# Patient Record
Sex: Female | Born: 1995
Health system: Southern US, Community
[De-identification: ages and names within clinical notes are randomized; demographics above are authoritative.]

## PROBLEM LIST (undated history)

## (undated) DIAGNOSIS — E282 Polycystic ovarian syndrome: Secondary | ICD-10-CM

## (undated) DIAGNOSIS — R000 Tachycardia, unspecified: Secondary | ICD-10-CM

## (undated) DIAGNOSIS — H5210 Myopia, unspecified eye: Secondary | ICD-10-CM

## (undated) DIAGNOSIS — I959 Hypotension, unspecified: Secondary | ICD-10-CM

## (undated) HISTORY — DX: Tachycardia, unspecified: R00.0

## (undated) HISTORY — DX: Myopia, unspecified eye: H52.10

## (undated) HISTORY — DX: Polycystic ovarian syndrome: E28.2

## (undated) HISTORY — DX: Hypotension, unspecified: I95.9

---
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
## 2016-02-16 DIAGNOSIS — R61 Generalized hyperhidrosis: Secondary | ICD-10-CM | POA: Insufficient documentation

## 2016-02-16 DIAGNOSIS — L700 Acne vulgaris: Secondary | ICD-10-CM | POA: Insufficient documentation

## 2016-02-16 DIAGNOSIS — I889 Nonspecific lymphadenitis, unspecified: Secondary | ICD-10-CM | POA: Insufficient documentation

## 2017-09-04 DIAGNOSIS — H5213 Myopia, bilateral: Secondary | ICD-10-CM | POA: Insufficient documentation

## 2017-09-25 DIAGNOSIS — R635 Abnormal weight gain: Secondary | ICD-10-CM | POA: Insufficient documentation

## 2017-09-25 DIAGNOSIS — L680 Hirsutism: Secondary | ICD-10-CM | POA: Insufficient documentation

## 2017-10-02 DIAGNOSIS — E282 Polycystic ovarian syndrome: Secondary | ICD-10-CM | POA: Insufficient documentation

## 2019-12-27 DIAGNOSIS — H16223 Keratoconjunctivitis sicca, not specified as Sjogren's, bilateral: Secondary | ICD-10-CM | POA: Insufficient documentation

## 2019-12-27 DIAGNOSIS — H43393 Other vitreous opacities, bilateral: Secondary | ICD-10-CM | POA: Insufficient documentation

## 2020-11-19 DIAGNOSIS — Z01419 Encounter for gynecological examination (general) (routine) without abnormal findings: Secondary | ICD-10-CM | POA: Diagnosis not present

## 2020-11-19 DIAGNOSIS — E282 Polycystic ovarian syndrome: Secondary | ICD-10-CM | POA: Diagnosis not present

## 2020-11-19 DIAGNOSIS — Z6836 Body mass index (BMI) 36.0-36.9, adult: Secondary | ICD-10-CM | POA: Diagnosis not present

## 2020-11-20 LAB — HM PAP SMEAR: HM Pap smear: NEGATIVE

## 2020-12-07 DIAGNOSIS — H04123 Dry eye syndrome of bilateral lacrimal glands: Secondary | ICD-10-CM | POA: Diagnosis not present

## 2020-12-07 DIAGNOSIS — H52203 Unspecified astigmatism, bilateral: Secondary | ICD-10-CM | POA: Diagnosis not present

## 2020-12-07 DIAGNOSIS — H5213 Myopia, bilateral: Secondary | ICD-10-CM | POA: Diagnosis not present

## 2021-03-28 ENCOUNTER — Other Ambulatory Visit: Payer: Self-pay

## 2021-03-28 ENCOUNTER — Ambulatory Visit (INDEPENDENT_AMBULATORY_CARE_PROVIDER_SITE_OTHER): Payer: 59 | Admitting: Family Medicine

## 2021-03-28 ENCOUNTER — Encounter: Payer: Self-pay | Admitting: Family Medicine

## 2021-03-28 VITALS — BP 122/84 | HR 87 | Temp 97.2°F | Ht 64.5 in | Wt 214.8 lb

## 2021-03-28 DIAGNOSIS — L732 Hidradenitis suppurativa: Secondary | ICD-10-CM | POA: Insufficient documentation

## 2021-03-28 DIAGNOSIS — E282 Polycystic ovarian syndrome: Secondary | ICD-10-CM

## 2021-03-28 DIAGNOSIS — Z13220 Encounter for screening for lipoid disorders: Secondary | ICD-10-CM

## 2021-03-28 DIAGNOSIS — Z1329 Encounter for screening for other suspected endocrine disorder: Secondary | ICD-10-CM

## 2021-03-28 DIAGNOSIS — Z0000 Encounter for general adult medical examination without abnormal findings: Secondary | ICD-10-CM

## 2021-03-28 NOTE — Progress Notes (Signed)
Anne Cardenas is a 25 y.o. female  Chief Complaint  Patient presents with  . New Patient (Initial Visit)    Pt is here for physical, states she has had both breast and pp done by her OB, done Nov 19, 2020, pap was normal.     HPI: NIKYAH Cardenas is a 25 y.o. female seen today to establish care with our office and for annual CPE, labs.  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   Last PAP: follows with GYN Dr. Linda Hedges, normal PAP in 10/2020  Diet/Exercise: walks a lot at work but no regular CV exercise, maybe once per week; overall healthy diet Dental: UTD Vision: wears contacts, UTD on exam  Med refills needed today? none  Past Medical History:  Diagnosis Date  . PCOS (polycystic ovarian syndrome)     History reviewed. No pertinent surgical history.  Social History   Socioeconomic History  . Marital status: Single    Spouse name: Not on file  . Number of children: Not on file  . Years of education: Not on file  . Highest education level: Not on file  Occupational History  . Not on file  Tobacco Use  . Smoking status: Never Smoker  . Smokeless tobacco: Never Used  Substance and Sexual Activity  . Alcohol use: No  . Drug use: No  . Sexual activity: Not Currently    Birth control/protection: None  Other Topics Concern  . Not on file  Social History Narrative  . Not on file   Social Determinants of Health   Financial Resource Strain: Not on file  Food Insecurity: Not on file  Transportation Needs: Not on file  Physical Activity: Not on file  Stress: Not on file  Social Connections: Not on file  Intimate Partner Violence: Not on file    Family History  Problem Relation Age of Onset  . Hypertension Mother   . Anxiety disorder Father   . Hyperlipidemia Father   . ADD / ADHD Sister   . Depression Sister   . Stroke Paternal Grandfather   . Heart disease Paternal Grandfather   . Hashimoto's thyroiditis Sister   . Anxiety disorder Sister   . ADD /  ADHD Sister       There is no immunization history on file for this patient.  Outpatient Encounter Medications as of 03/28/2021  Medication Sig Note  . aluminum chloride (DRYSOL) 20 % external solution Apply to affected area QHS   . bifidobacterium infantis (ALIGN) capsule 1 cap daily for bowel changes   . progesterone (PROMETRIUM) 100 MG capsule Take 100 mg by mouth daily. 03/28/2021: Pt takes this day 15-28 of cycle   . spironolactone (ALDACTONE) 50 MG tablet 100 mg.    No facility-administered encounter medications on file as of 03/28/2021.     ROS: Gen: no fever, chills  Skin: no rash, itching ENT: no ear pain, ear drainage, nasal congestion, rhinorrhea, sinus pressure, sore throat Eyes: no blurry vision, double vision Resp: no cough, wheeze,SOB Breast: no breast tenderness, no nipple discharge, no breast masses CV: no CP, palpitations, LE edema,  GI: no heartburn, n/v/d/c, abd pain GU: no dysuria, urgency, frequency, hematuria;+ PCOS MSK: no joint pain, myalgias, back pain Neuro: no dizziness, headache, weakness Psych: no depression, anxiety, insomnia   Allergies  Allergen Reactions  . Ciprofloxacin Anxiety    Elevated heart rate and panicked feeling     BP 122/84 (BP Location: Left Arm, Patient Position: Sitting,  Cuff Size: Normal)   Pulse 87   Temp (!) 97.2 F (36.2 C) (Temporal)   Ht 5' 4.5" (1.638 m)   Wt 214 lb 12.8 oz (97.4 kg)   SpO2 99%   BMI 36.30 kg/m    Wt Readings from Last 3 Encounters:  03/28/21 214 lb 12.8 oz (97.4 kg)  01/23/13 150 lb (68 kg) (86 %, Z= 1.09)*   * Growth percentiles are based on CDC (Girls, 2-20 Years) data.   Temp Readings from Last 3 Encounters:  03/28/21 (!) 97.2 F (36.2 C) (Temporal)  01/23/13 99.5 F (37.5 C) (Oral)   BP Readings from Last 3 Encounters:  03/28/21 122/84  01/23/13 124/80 (92 %, Z = 1.41 /  94 %, Z = 1.55)*   *BP percentiles are based on the 2017 AAP Clinical Practice Guideline for girls   Pulse  Readings from Last 3 Encounters:  03/28/21 87  01/23/13 120     Physical Exam Constitutional:      General: She is not in acute distress.    Appearance: She is well-developed. She is not ill-appearing.  HENT:     Head: Normocephalic and atraumatic.     Right Ear: Tympanic membrane and ear canal normal.     Left Ear: Tympanic membrane and ear canal normal.     Nose: Nose normal.     Mouth/Throat:     Mouth: Mucous membranes are moist.     Pharynx: Oropharynx is clear.  Eyes:     Conjunctiva/sclera: Conjunctivae normal.  Neck:     Thyroid: No thyromegaly.  Cardiovascular:     Rate and Rhythm: Normal rate and regular rhythm.     Heart sounds: Normal heart sounds. No murmur heard.   Pulmonary:     Effort: Pulmonary effort is normal. No respiratory distress.     Breath sounds: Normal breath sounds. No wheezing or rhonchi.  Abdominal:     General: Bowel sounds are normal. There is no distension.     Palpations: Abdomen is soft. There is no mass.     Tenderness: There is no abdominal tenderness.  Musculoskeletal:     Cervical back: Neck supple.     Right lower leg: No edema.     Left lower leg: No edema.  Lymphadenopathy:     Cervical: No cervical adenopathy.  Skin:    General: Skin is warm and dry.  Neurological:     Mental Status: She is alert and oriented to person, place, and time.     Motor: No abnormal muscle tone.     Coordination: Coordination normal.  Psychiatric:        Behavior: Behavior normal.      A/P:  1. Annual physical exam - discussed importance of regular CV exercise, healthy diet, adequate sleep - UTD on dental and vision - UTD on PAP, CBE - immunizations UTD - CBC - Basic metabolic panel - AST - ALT - next CPE in 1 year  2. Screening for lipid disorders - Direct LDL  3. Screening for thyroid disorder - sister with thyroid dysfunction - TSH  4. PCOS (polycystic ovarian syndrome) - follows with GYN Dr. Lynnette Caffey - Hemoglobin  A1c    This visit occurred during the SARS-CoV-2 public health emergency.  Safety protocols were in place, including screening questions prior to the visit, additional usage of staff PPE, and extensive cleaning of exam room while observing appropriate contact time as indicated for disinfecting solutions.

## 2021-03-29 ENCOUNTER — Encounter: Payer: Self-pay | Admitting: Family Medicine

## 2021-03-29 LAB — BASIC METABOLIC PANEL
BUN/Creatinine Ratio: 21 (ref 9–23)
BUN: 17 mg/dL (ref 6–20)
CO2: 20 mmol/L (ref 20–29)
Calcium: 10.2 mg/dL (ref 8.7–10.2)
Chloride: 100 mmol/L (ref 96–106)
Creatinine, Ser: 0.81 mg/dL (ref 0.57–1.00)
Glucose: 88 mg/dL (ref 65–99)
Potassium: 4.7 mmol/L (ref 3.5–5.2)
Sodium: 139 mmol/L (ref 134–144)
eGFR: 103 mL/min/{1.73_m2} (ref >59)

## 2021-03-29 LAB — HEMOGLOBIN A1C
Est. average glucose Bld gHb Est-mCnc: 97 mg/dL
Hgb A1c MFr Bld: 5 % (ref 4.8–5.6)

## 2021-03-29 LAB — ALT: ALT: 15 IU/L (ref 0–32)

## 2021-03-29 LAB — CBC
Hematocrit: 44.2 % (ref 34.0–46.6)
Hemoglobin: 15.4 g/dL (ref 11.1–15.9)
MCH: 29.1 pg (ref 26.6–33.0)
MCHC: 34.8 g/dL (ref 31.5–35.7)
MCV: 84 fL (ref 79–97)
Platelets: 313 10*3/uL (ref 150–450)
RBC: 5.29 x10E6/uL — ABNORMAL HIGH (ref 3.77–5.28)
RDW: 12.2 % (ref 11.7–15.4)
WBC: 9.9 10*3/uL (ref 3.4–10.8)

## 2021-03-29 LAB — LDL CHOLESTEROL, DIRECT: Direct LDL: 98 mg/dL

## 2021-03-29 LAB — TSH: TSH: 1.44 u[IU]/mL (ref 0.450–4.500)

## 2021-03-29 LAB — AST: AST: 17 IU/L (ref 0–40)

## 2021-11-08 ENCOUNTER — Other Ambulatory Visit (HOSPITAL_BASED_OUTPATIENT_CLINIC_OR_DEPARTMENT_OTHER): Payer: Self-pay

## 2021-11-08 ENCOUNTER — Other Ambulatory Visit: Payer: Self-pay

## 2021-11-08 ENCOUNTER — Ambulatory Visit: Payer: 59 | Attending: Internal Medicine

## 2021-11-08 DIAGNOSIS — Z23 Encounter for immunization: Secondary | ICD-10-CM

## 2021-11-08 MED ORDER — PFIZER COVID-19 VAC BIVALENT 30 MCG/0.3ML IM SUSP
INTRAMUSCULAR | 0 refills | Status: DC
  Filled 2021-11-08: qty 0.3, 1d supply, fill #0

## 2021-11-08 NOTE — Progress Notes (Signed)
   Covid-19 Vaccination Clinic  Name:  Anne Cardenas    MRN: [id number] DOB: 12/06/1996  11/08/2021  [name] was observed post Covid-19 immunization for 15 minutes without incident. She was provided with Vaccine Information Sheet and instruction to access the V-Safe system.   [name] was instructed to call 911 with any severe reactions post vaccine: Difficulty breathing  Swelling of face and throat  A fast heartbeat  A bad rash all over body  Dizziness and weakness   Immunizations Administered     Name Date Dose VIS Date Route   Pfizer Covid-19 Vaccine Bivalent Booster 11/08/2021  3:34 PM 0.3 mL 08/28/2021 Intramuscular   Manufacturer: [place]   Lot: [id number]   [place]: [phone]

## 2021-12-13 DIAGNOSIS — H5213 Myopia, bilateral: Secondary | ICD-10-CM | POA: Diagnosis not present

## 2021-12-13 DIAGNOSIS — H52203 Unspecified astigmatism, bilateral: Secondary | ICD-10-CM | POA: Diagnosis not present

## 2022-01-15 DIAGNOSIS — Z01419 Encounter for gynecological examination (general) (routine) without abnormal findings: Secondary | ICD-10-CM | POA: Diagnosis not present

## 2022-01-15 DIAGNOSIS — E282 Polycystic ovarian syndrome: Secondary | ICD-10-CM | POA: Diagnosis not present

## 2022-01-15 DIAGNOSIS — Z6831 Body mass index (BMI) 31.0-31.9, adult: Secondary | ICD-10-CM | POA: Diagnosis not present

## 2022-01-15 DIAGNOSIS — R61 Generalized hyperhidrosis: Secondary | ICD-10-CM | POA: Diagnosis not present

## 2022-04-02 ENCOUNTER — Encounter: Payer: 59 | Admitting: Family Medicine

## 2022-04-24 ENCOUNTER — Emergency Department (HOSPITAL_COMMUNITY)
Admission: EM | Admit: 2022-04-24 | Discharge: 2022-04-24 | Disposition: A | Discharge status: Home / Self Care | Payer: 59 | Attending: Emergency Medicine | Admitting: Emergency Medicine

## 2022-04-24 ENCOUNTER — Emergency Department (HOSPITAL_COMMUNITY): Payer: 59

## 2022-04-24 ENCOUNTER — Encounter (HOSPITAL_COMMUNITY): Payer: Self-pay | Admitting: Emergency Medicine

## 2022-04-24 ENCOUNTER — Other Ambulatory Visit: Payer: Self-pay

## 2022-04-24 DIAGNOSIS — R000 Tachycardia, unspecified: Secondary | ICD-10-CM | POA: Diagnosis not present

## 2022-04-24 DIAGNOSIS — Z20822 Contact with and (suspected) exposure to covid-19: Secondary | ICD-10-CM | POA: Insufficient documentation

## 2022-04-24 DIAGNOSIS — I10 Essential (primary) hypertension: Secondary | ICD-10-CM | POA: Diagnosis not present

## 2022-04-24 DIAGNOSIS — R509 Fever, unspecified: Secondary | ICD-10-CM | POA: Insufficient documentation

## 2022-04-24 DIAGNOSIS — R42 Dizziness and giddiness: Secondary | ICD-10-CM | POA: Insufficient documentation

## 2022-04-24 DIAGNOSIS — D72829 Elevated white blood cell count, unspecified: Secondary | ICD-10-CM | POA: Diagnosis not present

## 2022-04-24 LAB — URINALYSIS, ROUTINE W REFLEX MICROSCOPIC
Bacteria, UA: NONE SEEN
Bilirubin Urine: NEGATIVE
Glucose, UA: NEGATIVE mg/dL
Ketones, ur: 20 mg/dL — AB
Leukocytes,Ua: NEGATIVE
Nitrite: NEGATIVE
Protein, ur: NEGATIVE mg/dL
Specific Gravity, Urine: 1.016 (ref 1.005–1.030)
pH: 7 (ref 5.0–8.0)

## 2022-04-24 LAB — COMPREHENSIVE METABOLIC PANEL
ALT: 20 U/L (ref 0–44)
AST: 18 U/L (ref 15–41)
Albumin: 4.6 g/dL (ref 3.5–5.0)
Alkaline Phosphatase: 80 U/L (ref 38–126)
Anion gap: 8 (ref 5–15)
BUN: 15 mg/dL (ref 6–20)
CO2: 25 mmol/L (ref 22–32)
Calcium: 9.3 mg/dL (ref 8.9–10.3)
Chloride: 105 mmol/L (ref 98–111)
Creatinine, Ser: 0.95 mg/dL (ref 0.44–1.00)
GFR, Estimated: >60 mL/min (ref >60)
Glucose, Bld: 103 mg/dL — ABNORMAL HIGH (ref 70–99)
Potassium: 3.8 mmol/L (ref 3.5–5.1)
Sodium: 138 mmol/L (ref 135–145)
Total Bilirubin: 1.3 mg/dL — ABNORMAL HIGH (ref 0.3–1.2)
Total Protein: 7.3 g/dL (ref 6.5–8.1)

## 2022-04-24 LAB — I-STAT BETA HCG BLOOD, ED (MC, WL, AP ONLY): I-stat hCG, quantitative: <5 m[IU]/mL (ref <5)

## 2022-04-24 LAB — CBC WITH DIFFERENTIAL/PLATELET
Abs Immature Granulocytes: 0.11 10*3/uL — ABNORMAL HIGH (ref 0.00–0.07)
Basophils Absolute: 0 10*3/uL (ref 0.0–0.1)
Basophils Relative: 0 %
Eosinophils Absolute: 0.1 10*3/uL (ref 0.0–0.5)
Eosinophils Relative: 0 %
HCT: 44.8 % (ref 36.0–46.0)
Hemoglobin: 15.6 g/dL — ABNORMAL HIGH (ref 12.0–15.0)
Immature Granulocytes: 1 %
Lymphocytes Relative: 6 %
Lymphs Abs: 0.9 10*3/uL (ref 0.7–4.0)
MCH: 29.4 pg (ref 26.0–34.0)
MCHC: 34.8 g/dL (ref 30.0–36.0)
MCV: 84.4 fL (ref 80.0–100.0)
Monocytes Absolute: 0.6 10*3/uL (ref 0.1–1.0)
Monocytes Relative: 3 %
Neutro Abs: 15.1 10*3/uL — ABNORMAL HIGH (ref 1.7–7.7)
Neutrophils Relative %: 90 %
Platelets: 310 10*3/uL (ref 150–400)
RBC: 5.31 MIL/uL — ABNORMAL HIGH (ref 3.87–5.11)
RDW: 12.4 % (ref 11.5–15.5)
WBC: 16.8 10*3/uL — ABNORMAL HIGH (ref 4.0–10.5)
nRBC: 0 % (ref 0.0–0.2)

## 2022-04-24 LAB — T4, FREE: Free T4: 1.2 ng/dL — ABNORMAL HIGH (ref 0.61–1.12)

## 2022-04-24 LAB — RAPID URINE DRUG SCREEN, HOSP PERFORMED
Amphetamines: NOT DETECTED
Barbiturates: NOT DETECTED
Benzodiazepines: NOT DETECTED
Cocaine: NOT DETECTED
Opiates: NOT DETECTED
Tetrahydrocannabinol: NOT DETECTED

## 2022-04-24 LAB — TSH: TSH: 1.065 u[IU]/mL (ref 0.350–4.500)

## 2022-04-24 LAB — LACTIC ACID, PLASMA: Lactic Acid, Venous: 1.2 mmol/L (ref 0.5–1.9)

## 2022-04-24 LAB — D-DIMER, QUANTITATIVE: D-Dimer, Quant: <0.27 ug/mL-FEU (ref 0.00–0.50)

## 2022-04-24 LAB — RESP PANEL BY RT-PCR (FLU A&B, COVID) ARPGX2
Influenza A by PCR: NEGATIVE
Influenza B by PCR: NEGATIVE
SARS Coronavirus 2 by RT PCR: NEGATIVE

## 2022-04-24 MED ORDER — KETOROLAC TROMETHAMINE 30 MG/ML IJ SOLN
30.0000 mg | Freq: Once | INTRAMUSCULAR | Status: AC
  Administered 2022-04-24: 30 mg via INTRAVENOUS
  Filled 2022-04-24: qty 1

## 2022-04-24 MED ORDER — LACTATED RINGERS IV BOLUS
500.0000 mL | Freq: Once | INTRAVENOUS | Status: AC
  Administered 2022-04-24: 500 mL via INTRAVENOUS

## 2022-04-24 MED ORDER — LACTATED RINGERS IV BOLUS
1000.0000 mL | Freq: Once | INTRAVENOUS | Status: AC
  Administered 2022-04-24: 1000 mL via INTRAVENOUS

## 2022-04-24 MED ORDER — ACETAMINOPHEN 500 MG PO TABS
1000.0000 mg | ORAL_TABLET | Freq: Four times a day (QID) | ORAL | Status: DC | PRN
  Administered 2022-04-24: 1000 mg via ORAL
  Filled 2022-04-24: qty 2

## 2022-04-24 NOTE — ED Provider Notes (Signed)
?Anne Cardenas DEPT ?Provider Note ? ? ?CSN: [id number] ?Arrival date & time: 04/24/22  1523 ? ?  ? ?History ? ?Chief Complaint  ?Patient presents with  ? Hypertension  ? Dizziness  ? ? ?Anne Cardenas is a 26 y.o. female. ? ?HPI ? ?  ? 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 ?Began to feel lightheaded, heart rate began to become high ?Thought it would get better, went on to see the next patient ?[facility], blood pressure checked.  Blood sugar was 94.  Blood pressure 148/100s, HR 113, and has continued to be elevated ?Went to lay down, felt a little better but continued ?Laid down for an hour and continued to feel tachycardic, face felt hot ?Has not had a headache but feels like might get one ?No chest pain or dyspnea ?No nausea or vomiting, diarrhea, black or bloody stools ?No cough, abd, vaginal bleeding, discharge, rash ?No long trips, recent surgeries or immobilization, leg pain or swelling ?No hx of thyroid problems, has been checked before because sister has hashimotos ?Eating and drinking ok ?Lightheaded, Denies numbness, weakness, difficulty talking or walking, visual changes or facial droop.   ? ?Hx of PCOS, takes spironolactone, cyclic progesterone ?Cysts under arms in college, no concern for this now ?Take probiotic ?Have not stopped any medications ?No smoking or other drugs ?1/week max etoh ?No hx of SI/not feeling anxious or stressed ? ?Past Medical History:  ?Diagnosis Date  ? PCOS (polycystic ovarian syndrome)   ?  ?Home Medications ?Prior to Admission medications   ?Medication Sig Start Date End Date Taking? Authorizing Provider  ?aluminum chloride (DRYSOL) 20 % external solution Apply 1 application. topically daily as  needed (excessive sweating). 02/18/19  Yes [provider]  ?bifidobacterium infantis (ALIGN) capsule Take 1 capsule by mouth daily. 09/16/17  Yes [provider]  ?ibuprofen (ADVIL) 200 MG tablet Take 400 mg by mouth every 6 (six) hours as needed for headache.   Yes [provider]  ?progesterone (PROMETRIUM) 200 MG capsule Take 200 mg by mouth See admin instructions. Takes during day 15 to 28 of cycle.   Yes [provider]  ?spironolactone (ALDACTONE) 100 MG tablet Take 100 mg by mouth daily.   Yes [provider]  ?COVID-19 mRNA bivalent vaccine, Pfizer, (PFIZER COVID-19 VAC BIVALENT) injection Inject into the muscle. 11/08/21     ?   ? ?Allergies    ?Ciprofloxacin   ? ?Review of Systems   ?Review of Systems ? ?Physical Exam ?Updated Vital Signs ?BP 111/72   Pulse (!) 105   Temp 98.4 ?F (36.9 ?C) (Oral)   Resp (!) 23   SpO2 96%  ?Physical Exam ?Vitals and nursing note reviewed.  ?Constitutional:   ?   General: She is not in acute distress. ?   Appearance: She is well-developed. She is not diaphoretic.  ?HENT:  ?   Head: Normocephalic and atraumatic.  ?   Right Ear: Tympanic membrane normal.  ?   Left Ear: Tympanic membrane normal.  ?   Mouth/Throat:  ?   Pharynx: No oropharyngeal exudate.  ?Eyes:  ?   Conjunctiva/sclera: Conjunctivae normal.  ?Neck:  ?   Comments: Negative kernigs/brudzinski's ?Cardiovascular:  ?   Rate and Rhythm: Regular rhythm. Tachycardia  present.  ?   Heart sounds: Normal heart sounds. No murmur heard. ?  No friction rub. No gallop.  ?Pulmonary:  ?   Effort: Pulmonary effort is normal. No respiratory distress.  ?   Breath sounds: Normal breath sounds. No wheezing or rales.  ?Abdominal:  ?   General: There is no distension.  ?   Palpations: Abdomen is soft.  ?   Tenderness: There is no abdominal tenderness. There is no guarding.  ?Musculoskeletal:     ?   General: No tenderness.  ?   Cervical back: Normal range of motion and neck supple. No  rigidity.  ?   Right lower leg: No edema.  ?   Left lower leg: No edema.  ?Skin: ?   General: Skin is warm and dry.  ?   Findings: No erythema or rash.  ?Neurological:  ?   Mental Status: She is alert and oriented to person, place, and time.  ? ? ?ED Results / Procedures / Treatments   ?Labs ?(all labs ordered are listed, but only abnormal results are displayed) ?Labs Reviewed  ?CBC WITH DIFFERENTIAL/PLATELET - Abnormal; Notable for the following components:  ?    Result Value  ? WBC 16.8 (*)   ? RBC 5.31 (*)   ? Hemoglobin 15.6 (*)   ? Neutro Abs 15.1 (*)   ? Abs Immature Granulocytes 0.11 (*)   ? All other components within normal limits  ?COMPREHENSIVE METABOLIC PANEL - Abnormal; Notable for the following components:  ? Glucose, Bld 103 (*)   ? Total Bilirubin 1.3 (*)   ? All other components within normal limits  ?URINALYSIS, ROUTINE W REFLEX MICROSCOPIC - Abnormal; Notable for the following components:  ? Hgb urine dipstick SMALL (*)   ? Ketones, ur 20 (*)   ? All other components within normal limits  ?T4, FREE - Abnormal; Notable for the following components:  ? Free T4 1.20 (*)   ? All other components within normal limits  ?RESP PANEL BY RT-PCR (FLU A&B, COVID) ARPGX2  ?CULTURE, BLOOD (ROUTINE X 2)  ?CULTURE, BLOOD (ROUTINE X 2)  ?RESPIRATORY PANEL BY PCR  ?RAPID URINE DRUG SCREEN, HOSP PERFORMED  ?TSH  ?D-DIMER, QUANTITATIVE  ?LACTIC ACID, PLASMA  ?I-STAT BETA HCG BLOOD, ED ([facility], [facility], [facility] ONLY)  ? ? ?EKG ?EKG Interpretation ? ?Date/Time:  Thursday April 24 2022 16:03:48 EDT ?Ventricular Rate:  112 ?PR Interval:  213 ?QRS Duration: 75 ?QT Interval:  315 ?QTC Calculation: 430 ?R Axis:   73 ?Text Interpretation: Sinus tachycardia Prolonged PR interval No previous ECGs available Confirmed by Gareth Morgan [phone]) on 04/24/2022 4:05:14 PM ? ?Radiology ?DG Chest Portable 1 View ? ?Result Date: 04/24/2022 ?CLINICAL DATA:  Tachycardia, leukocytosis, hypertension and dizziness. EXAM: PORTABLE CHEST 1 VIEW COMPARISON:   None. FINDINGS: The heart size and mediastinal contours are within normal limits. Both lungs are clear. The visualized skeletal structures are unremarkable. IMPRESSION: No active disease. Electronically Signed   By: Franchot Gallo M.D.   On: 04/24/2022 19:31   ? ?Procedures ?Procedures  ? ? ?Medications Ordered in ED ?Medications  ?acetaminophen (TYLENOL) tablet 1,000 mg (1,000 mg Oral Given 04/24/22 1856)  ?lactated ringers bolus 1,000 mL (0 mLs Intravenous Stopped 04/24/22 2321)  ?lactated ringers bolus 500 mL (0 mLs Intravenous Stopped 04/24/22 2321)  ?ketorolac (TORADOL) 30 MG/ML injection 30 mg (30 mg Intravenous Given 04/24/22 2059)  ? ? ?ED Course/ Medical Decision Making/ A&P ?  ?                        ?  Medical Decision Making ?Amount and/or Complexity of Data Reviewed ?Labs: ordered. ?Radiology: ordered. ? ?Risk ?OTC drugs. ?Prescription drug management. ? ? 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 ? ?Differential diagnosis for elevated heart rate includes cardiac arrhythmia, dehydration, anemia, electrolyte abnormalities, hyperthyroidism, withdrawal, other medication/drug effect, infection, PE. ? ?Labs obtained and interpreted by me show a leukocytosis with a white count of 16.8 thousand, hemoglobin 15.6, no significant electrolyte abnormalities.  Given her persistent tachycardia, D-dimer was checked and was negative, low suspicion for pulmonary embolus.  Free T4 is borderline elevated, however TSH is normal and have low suspicion for thyrotoxicosis.  UDS negative. Denies ingestions, possibility of withdrawal. ECG shows sinus rhythm.  ? ?While in ED, developed a fever.  ?CXR without signs of pneumonia. UA without infection. No focal rash, sore throat, other infectious symptoms. No nuchal rigidity. Developed headache, suspect likely secondary to fever, do not suspect meningitis given normal mental status, no  meningeal signs on exam. Denies tick exposure, or being in high risk areas. Denies other pelvic symptoms or abdominal symptoms. No cp, dyspnea or pulmonary edema and doubt myocarditis. Denies drug use. ? ?Blood culture

## 2022-04-24 NOTE — ED Triage Notes (Signed)
Pt reports hypertension, dizziness, and tachycardia x a few hours. Denies headache.  ?

## 2022-04-25 LAB — RESPIRATORY PANEL BY PCR

## 2022-05-01 LAB — CULTURE, BLOOD (ROUTINE X 2)
Culture: NO GROWTH
Culture: NO GROWTH

## 2022-05-05 ENCOUNTER — Encounter (HOSPITAL_BASED_OUTPATIENT_CLINIC_OR_DEPARTMENT_OTHER): Payer: Self-pay | Admitting: Nurse Practitioner

## 2022-05-05 ENCOUNTER — Ambulatory Visit (HOSPITAL_BASED_OUTPATIENT_CLINIC_OR_DEPARTMENT_OTHER): Payer: 59 | Admitting: Nurse Practitioner

## 2022-05-05 VITALS — BP 100/70 | HR 86 | Ht 64.0 in | Wt 218.4 lb

## 2022-05-05 DIAGNOSIS — R1031 Right lower quadrant pain: Secondary | ICD-10-CM | POA: Diagnosis not present

## 2022-05-05 DIAGNOSIS — R000 Tachycardia, unspecified: Secondary | ICD-10-CM

## 2022-05-05 DIAGNOSIS — R194 Change in bowel habit: Secondary | ICD-10-CM | POA: Diagnosis not present

## 2022-05-05 DIAGNOSIS — E282 Polycystic ovarian syndrome: Secondary | ICD-10-CM

## 2022-05-05 DIAGNOSIS — Z7689 Persons encountering health services in other specified circumstances: Secondary | ICD-10-CM

## 2022-05-05 DIAGNOSIS — D72820 Lymphocytosis (symptomatic): Secondary | ICD-10-CM | POA: Diagnosis not present

## 2022-05-05 DIAGNOSIS — I959 Hypotension, unspecified: Secondary | ICD-10-CM

## 2022-05-05 NOTE — Patient Instructions (Addendum)
Thank you for choosing [place] at [facility] for your Primary Care needs. I am excited for the opportunity to partner with you to meet your health care goals. It was a pleasure meeting you today! ? ?Recommendations from today's visit: ?I want to check some labs today and make sure the white count has come back to normal.  ?I am also going to send a referral to cardiology for evaluation given the hypotension and tachycardia ?I have ordered an ultrasound for the abdomen to see if we can see anything related to the pain ? We will have this done at [facility] on [facility] inside [facility] ? ? ?Information on diet, exercise, and health maintenance recommendations are listed below. This is information to help you be sure you are on track for optimal health and monitoring.  ? ?Please look over this and let us know if you have any questions or if you have completed any of the health maintenance outside of [place] so that we can be sure your records are up to date.  ?___________________________________________________________ ?About Me: ?I am an Adult-Geriatric Nurse Practitioner with a background in caring for patients for more than 20 years with a strong intensive care background. I provide primary care and sports medicine services to patients age 50 and older within this office. My education had a strong focus on caring for the older adult population, which I am passionate about. I am also the director of the APP Fellowship with [facility].  ? ?My desire is to provide you with the best service through preventive medicine and supportive care. I consider you a part of the medical team and value your input. I work diligently to ensure that you are heard and your needs are met in a safe and effective manner. I want you to feel comfortable with me as your provider and want you to know that your health concerns are important to me. ? ?For your information, our office  hours are: ?Monday, Tuesday, and Thursday 8:00 AM - 5:00 PM ?Wednesday and Friday 8:00 AM - 12:00 PM.  ? ?In my time away from the office I am teaching new APP's within the system and am unavailable, but my partner, Dr. Burnard Bunting is in the office for emergent needs.  ? ?If you have questions or concerns, please call our office at [phone] or send [place] a MyChart message and we will respond as quickly as possible.  ?____________________________________________________________ ?MyChart:  ?For all urgent or time sensitive needs we ask that you please call the office to avoid delays. Our number is [phone]. ?MyChart is not constantly monitored and due to the large volume of messages a day, replies may take up to 72 business hours. ? ?MyChart Policy: ?MyChart allows for you to see your visit notes, after visit summary, provider recommendations, lab and tests results, make an appointment, request refills, and contact your provider or the office for non-urgent questions or concerns. Providers are seeing patients during normal business hours and do not have built in time to review MyChart messages.  ?We ask that you allow a minimum of 3 business days for responses to [employer]. For this reason, please do not send urgent requests through [place]. Please call the office at [phone]. ?New and ongoing conditions may require a visit. We have virtual and in person visit available for your convenience.  ?Complex MyChart concerns may require a visit. Your provider may request you schedule a virtual or in person  visit to ensure we are providing the best care possible. ?MyChart messages sent after 11:00 AM on Friday will not be received by the provider until Monday morning.  ?  ?Lab and Test Results: ?You will receive your lab and test results on MyChart as soon as they are completed and results have been sent by the lab or testing facility. Due to this service, you will receive your results BEFORE your provider.  ?I  review lab and tests results each morning prior to seeing patients. Some results require collaboration with other providers to ensure you are receiving the most appropriate care. For this reason, we ask that you please allow a minimum of 3-5 business days from the time the ALL results have been received for your provider to receive and review lab and test results and contact you about these.  ?Most lab and test result comments from the provider will be sent through [place]. Your provider may recommend changes to the plan of care, follow-up visits, repeat testing, ask questions, or request an office visit to discuss these results. You may reply directly to this message or call the office at [phone] to provide information for the provider or set up an appointment. ?In some instances, you will be called with test results and recommendations. Please let us know if this is preferred and we will make note of this in your chart to provide this for you.    ?If you have not heard a response to your lab or test results in 5 business days from all results returning to [place], please call the office to let us know. We ask that you please avoid calling prior to this time unless there is an emergent concern. Due to high call volumes, this can delay the resulting process. ? ?After Hours: ?For all non-emergency after hours needs, please call the office at [phone] and select the option to reach the on-call provider service. On-call services are shared between multiple [place] offices and therefore it will not be possible to speak directly with your provider. On-call providers may provide medical advice and recommendations, but are unable to provide refills for maintenance medications.  ?For all emergency or urgent medical needs after normal business hours, we recommend that you seek care at the closest Urgent Care or Emergency Department to ensure appropriate treatment in a timely manner.  ?[facility] at  [place] has a 24 hour emergency room located on the ground floor for your convenience.  ? ?Urgent Concerns During the Business Day ?Providers are seeing patients from 8AM to [place] with a busy schedule and are most often not able to respond to non-urgent calls until the end of the day or the next business day. ?If you should have URGENT concerns during the day, please call and speak to the nurse or schedule a same day appointment so that we can address your concern without delay.  ? ?Thank you, again, for choosing me as your health care partner. I appreciate your trust and look forward to learning more about you.  ? ?Worthy Keeler, DNP, AGNP-c ?___________________________________________________________ ? ?Health Maintenance Recommendations ?Screening Testing ?Mammogram ?Every 1 -2 years based on history and risk factors ?Starting at age 44 ?Pap Smear ?Ages 21-39 every 3 years ?Ages 69-65 every 5 years with HPV testing ?More frequent testing may be required based on results and history ?Colon Cancer Screening ?Every 1-10 years based on test performed, risk factors, and history ?Starting at age 73 ?Bone Density Screening ?Every 2-10 years based  on history ?Starting at age 39 for women ?Recommendations for men differ based on medication usage, history, and risk factors ?AAA Screening ?One time ultrasound ?Men 42-41 years old who have every smoked ?Lung Cancer Screening ?Low Dose Lung CT every 12 months ?Age 68-80 years with a 30 pack-year smoking history who still smoke or who have quit within the last 15 years ? ?Screening Labs ?Routine  Labs: Complete Blood Count (CBC), Complete Metabolic Panel (CMP), Cholesterol (Lipid Panel) ?Every 6-12 months based on history and medications ?May be recommended more frequently based on current conditions or previous results ?Hemoglobin A1c Lab ?Every 3-12 months based on history and previous results ?Starting at age 11 or earlier with diagnosis of diabetes, high cholesterol, BMI  >26, and/or risk factors ?Frequent monitoring for patients with diabetes to ensure blood sugar control ?Thyroid Panel (TSH w/ T3 & T4) ?Every 6 months based on history, symptoms, and risk factors ?May be re

## 2022-05-05 NOTE — Progress Notes (Signed)
?Orma Render, DNP, AGNP-c ?Primary Care & Sports Medicine ?[place], [place] [address] ?[phone] ? ?New patient visit ? ? ?Patient: Anne Cardenas   DOB: 21-Feb-1996   26 y.o. Female  MRN: [id number] ?Visit Date: 05/05/2022 ? ?Patient Care Team: ?Deborrah Mabin, Coralee Pesa, NP as PCP - General (Nurse Practitioner) ? ?Today's Vitals  ? 05/05/22 0814  ?BP: 100/70  ?Pulse: 86  ?SpO2: 97%  ?Weight: 218 lb 6.4 oz (99.1 kg)  ?Height: $RemoveB'5\' 4"'rlPCxBXg$  (1.626 m)  ? ?Body mass index is 37.49 kg/m?.  ? ?Today's healthcare provider: Orma Render, NP  ? ?Chief Complaint  ?Patient presents with  ? Establish Care  ?  Recent ED visit  ? ?Subjective  ?  ?JANECIA Cardenas is a 26 y.o. female who presents today as a new patient to establish care.  ?  ?Patient endorses the following concerns presently: ?Emanuela is an otherwise healthy female with medical history limited to PCOS which she reports is well controlled. ?She was recently seen in the ED (04/24/22) for a cluster of symptoms that presented the same day.  ?She endorses: ?- sudden onset of tachycardia and "feeling off" that started at work ?- presyncopal symptoms ?- went to ED under recommendations from provider she was working with ?- in ED found to be tachycardic with regular rhythm, hypertensive, febrile ?- Labs showed leukocytosis with normal blood cultures, CXR, UA ?- She was discharged with a diagnosis of suspected viral etiology ?- Since her visit to the ED she reports her symptoms have persisted intermittently ?-She reports "I have never felt like this before" ?-She is concerned because she has been an overall healthy female with no other concerning symptoms. ?-She denies chest pain, shortness of breath, loss of consciousness, nausea, vomiting, diarrhea ? ?Abdominal pain ?-She endorses intermittent right lower quadrant abdominal pain which is new ?- She has been having looser than normal stools. ?-No fever or chills present ?-She has symptoms  associated above however she is unclear if these are related ?-No urinary symptoms ? ?History reviewed and reveals the following: ?Past Medical History:  ?Diagnosis Date  ? PCOS (polycystic ovarian syndrome)   ? ?History reviewed. No pertinent surgical history. ?Family Status  ?Relation Name Status  ? Mother  Alive  ? Father  Alive  ? Sister  Alive  ? MGM  Deceased  ? MGF  Deceased  ? PGM  Deceased  ? PGF  Deceased  ? Sister  Alive  ? ?Family History  ?Problem Relation Age of Onset  ? Hypertension Mother   ? Anxiety disorder Father   ? Hyperlipidemia Father   ? ADD / ADHD Sister   ? Depression Sister   ? Stroke Paternal Grandfather   ? Heart disease Paternal Grandfather   ? Hashimoto's thyroiditis Sister   ? Anxiety disorder Sister   ? ADD / ADHD Sister   ? ?Social History  ? ?Socioeconomic History  ? Marital status: Single  ?  Spouse name: Not on file  ? Number of children: Not on file  ? Years of education: Not on file  ? Highest education level: Not on file  ?Occupational History  ? Not on file  ?Tobacco Use  ? Smoking status: Never  ? Smokeless tobacco: Never  ?Substance and Sexual Activity  ? Alcohol use: No  ? Drug use: No  ? Sexual activity: Not Currently  ?  Birth control/protection: None  ?Other Topics Concern  ? Not  on file  ?Social History Narrative  ? Not on file  ? ?Social Determinants of Health  ? ?Financial Resource Strain: Not on file  ?Food Insecurity: Not on file  ?Transportation Needs: Not on file  ?Physical Activity: Not on file  ?Stress: Not on file  ?Social Connections: Not on file  ? ?Outpatient Medications Prior to Visit  ?Medication Sig  ? aluminum chloride (DRYSOL) 20 % external solution Apply 1 application. topically daily as needed (excessive sweating).  ? bifidobacterium infantis (ALIGN) capsule Take 1 capsule by mouth daily.  ? COVID-19 mRNA bivalent vaccine, Pfizer, (PFIZER COVID-19 VAC BIVALENT) injection Inject into the muscle.  ? ibuprofen (ADVIL) 200 MG tablet Take 400 mg by mouth  every 6 (six) hours as needed for headache.  ? progesterone (PROMETRIUM) 200 MG capsule Take 200 mg by mouth See admin instructions. Takes during day 15 to 28 of cycle.  ? spironolactone (ALDACTONE) 100 MG tablet Take 100 mg by mouth daily.  ? ?No facility-administered medications prior to visit.  ? ?Allergies  ?Allergen Reactions  ? Ciprofloxacin Anxiety  ?  Elevated heart rate and panicked feeling   ? ?Immunization History  ?Administered Date(s) Administered  ? DTaP 05/05/1996, 07/07/1996, 09/09/1996, 06/12/1997, 02/18/2001  ? HPV Quadrivalent 04/02/2006, 06/11/2006, 10/22/2006  ? Hepatitis A, Adult 05/01/2016, 05/13/2016  ? Hepatitis A, Ped/Adol-2 Dose 05/01/2016, 05/13/2016  ? Hepatitis B 11-23-1996, 05/05/1996, 12/09/1996  ? Hepatitis B, ped/adol 09/03/96, 05/05/1996, 12/09/1996  ? HiB (PRP-OMP) 05/05/1996, 07/07/1996, 09/09/1996, 06/12/1997  ? HiB (PRP-T) 05/05/1996, 07/07/1996, 09/09/1996, 06/12/1997  ? Hpv-Unspecified 04/02/2006, 06/11/2006, 10/22/2006  ? IPV 05/05/1996, 07/07/1996, 03/09/1997, 02/18/2001  ? Influenza,inj,Quad PF,6+ Mos 10/24/2016  ? Influenza-Unspecified 10/06/2018, 09/28/2020  ? MMR 03/09/1997, 02/18/2001  ? Meningococcal Conjugate 07/07/2013, 07/07/2013, 07/07/2013  ? PPD Test 08/06/2016, 08/13/2016, 04/12/2018, 04/13/2019  ? Pension scheme manager 41yrs & up 11/08/2021  ? Pneumococcal Conjugate-13 03/28/1999  ? Pneumococcal-Unspecified 03/18/1999, 03/28/1999  ? Tdap 05/01/2016  ? Varicella 02/18/2001, 07/12/2008  ? ? ?Review of Systems ?All review of systems negative except what is listed in the HPI ? ? Objective  ?  ?BP 100/70   Pulse 86   Ht $R'5\' 4"'oQ$  (1.626 m)   Wt 218 lb 6.4 oz (99.1 kg)   SpO2 97%   BMI 37.49 kg/m?  ?Physical Exam ?Vitals and nursing note reviewed.  ?Constitutional:   ?   General: She is not in acute distress. ?   Appearance: Normal appearance.  ?HENT:  ?   Head: Normocephalic and atraumatic.  ?Eyes:  ?   Extraocular Movements: Extraocular  movements intact.  ?   Conjunctiva/sclera: Conjunctivae normal.  ?   Pupils: Pupils are equal, round, and reactive to light.  ?Neck:  ?   Vascular: No carotid bruit.  ?Cardiovascular:  ?   Rate and Rhythm: Normal rate and regular rhythm.  ?   Pulses: Normal pulses.  ?   Heart sounds: Normal heart sounds. No murmur heard. ?Pulmonary:  ?   Effort: Pulmonary effort is normal.  ?   Breath sounds: Normal breath sounds. No wheezing.  ?Abdominal:  ?   General: Bowel sounds are normal. There is no distension.  ?   Palpations: Abdomen is soft. There is no mass.  ?   Tenderness: There is abdominal tenderness in the right lower quadrant. There is no right CVA tenderness, left CVA tenderness, guarding or rebound.  ?   Hernia: No hernia is present.  ?Musculoskeletal:     ?   General: Normal  range of motion.  ?   Cervical back: Normal range of motion.  ?   Right lower leg: No edema.  ?   Left lower leg: No edema.  ?Skin: ?   General: Skin is warm and dry.  ?   Capillary Refill: Capillary refill takes less than 2 seconds.  ?Neurological:  ?   General: No focal deficit present.  ?   Mental Status: She is alert and oriented to person, place, and time.  ?   Cranial Nerves: No cranial nerve deficit.  ?   Sensory: No sensory deficit.  ?   Motor: No weakness.  ?   Gait: Gait normal.  ?Psychiatric:     ?   Mood and Affect: Mood normal.     ?   Behavior: Behavior normal.     ?   Thought Content: Thought content normal.     ?   Judgment: Judgment normal.  ? ? ?No results found for any visits on 05/05/22. ? Assessment & Plan   ?  ? ?Problem List Items Addressed This Visit   ? ? PCOS (polycystic ovarian syndrome)  ?  Chronic.  Well-controlled.  No alarm symptoms present today. ? ?  ?  ? Tachycardia  ?  Recent evaluation in the emergency room for sudden onset of tachycardia, hypertension, and fever.  She was also experiencing presyncopal episodes at that time.  Complete work-up in the emergency room showed no etiology for the current  symptoms.  Today in the office her blood pressure is 100/70. ?At this time it is unclear what is causing her symptoms.  We will repeat labs today and obtain additional labs for monitoring for possible infectious diseases

## 2022-05-09 ENCOUNTER — Ambulatory Visit
Admission: RE | Admit: 2022-05-09 | Discharge: 2022-05-09 | Disposition: A | Discharge status: Home / Self Care | Payer: 59 | Source: Ambulatory Visit | Attending: Nurse Practitioner | Admitting: Nurse Practitioner

## 2022-05-09 DIAGNOSIS — D72820 Lymphocytosis (symptomatic): Secondary | ICD-10-CM | POA: Insufficient documentation

## 2022-05-09 DIAGNOSIS — R1031 Right lower quadrant pain: Secondary | ICD-10-CM

## 2022-05-09 DIAGNOSIS — G901 Familial dysautonomia [Riley-Day]: Secondary | ICD-10-CM | POA: Insufficient documentation

## 2022-05-09 DIAGNOSIS — R000 Tachycardia, unspecified: Secondary | ICD-10-CM

## 2022-05-09 DIAGNOSIS — R194 Change in bowel habit: Secondary | ICD-10-CM

## 2022-05-09 DIAGNOSIS — K760 Fatty (change of) liver, not elsewhere classified: Secondary | ICD-10-CM | POA: Diagnosis not present

## 2022-05-09 DIAGNOSIS — I959 Hypotension, unspecified: Secondary | ICD-10-CM | POA: Insufficient documentation

## 2022-05-09 HISTORY — DX: Tachycardia, unspecified: R00.0

## 2022-05-09 HISTORY — DX: Right lower quadrant pain: R10.31

## 2022-05-09 HISTORY — DX: Change in bowel habit: R19.4

## 2022-05-09 NOTE — Assessment & Plan Note (Signed)
See right lower quadrant abdominal pain. ?

## 2022-05-09 NOTE — Assessment & Plan Note (Addendum)
Right lower quadrant pain in the setting of presyncope, tachycardia, fever, and feeling off.  Recent ED evaluation suspected a viral etiology.  Symptoms are ongoing however her blood pressure is low in the office today.  At this time etiology is unclear.  There are no significant alarm symptoms present.  There is mild tenderness to the right lower quadrant.  No rebound pain present.  Bowel sounds normal.  Patient is having bowel movements.  Unclear at this time if patient could have a GI illness which is contributing.  We will obtain repeat and new labs today for further monitoring.  We will also send order for abdominal ultrasound to monitor for any changes within the abdomen.  Patient encouraged to monitor symptoms closely and report any new or worsening symptoms.  We will plan to follow-up based on labs and imaging evaluation. ?

## 2022-05-09 NOTE — Assessment & Plan Note (Signed)
History of hypotension in the emergency room with recent evaluation for tachycardia and presyncope.  Today in the office her blood pressure is 100/70 which she reports is lower than her typical levels.  At this time etiology is unclear.  Her evaluation and work-up in the ED were negative for any concerning findings. ?We will obtain repeat labs today for further evaluation and also monitoring for possible tick concerns or thyroid concern. ?Encourage patient to monitor symptoms closely and report any new or worsening symptoms immediately or present to the emergency room if severe symptoms are present. ?Encourage patient to increase water and salt intake.  Will send referral to cardiology for further evaluation given the tachycardia and palpitations as well as hypertension that are present. ?We will determine follow-up based on labs and other findings. ?

## 2022-05-09 NOTE — Assessment & Plan Note (Signed)
Recent evaluation in the emergency room for sudden onset of tachycardia, hypertension, and fever.  She was also experiencing presyncopal episodes at that time.  Complete work-up in the emergency room showed no etiology for the current symptoms.  Today in the office her blood pressure is 100/70. ?At this time it is unclear what is causing her symptoms.  We will repeat labs today and obtain additional labs for monitoring for possible infectious diseases that could be contributing to the symptoms present. ?We will send referral to cardiology for further evaluation given the blood pressure variations as well as heart rate variations of been experienced and presyncopal episodes. ?Encourage patient to increase water and salt intake and monitor closely for new or worsening symptoms and report immediately or present to the emergency room if these are severe. ?We will determine follow-up based on lab results and referral. ?

## 2022-05-09 NOTE — Assessment & Plan Note (Signed)
Chronic.  Well-controlled.  No alarm symptoms present today. ?

## 2022-05-12 ENCOUNTER — Encounter (HOSPITAL_BASED_OUTPATIENT_CLINIC_OR_DEPARTMENT_OTHER): Payer: Self-pay | Admitting: Nurse Practitioner

## 2022-05-12 LAB — CBC WITH DIFFERENTIAL/PLATELET
Basophils Absolute: 0 10*3/uL (ref 0.0–0.2)
Basos: 1 %
EOS (ABSOLUTE): 0 10*3/uL (ref 0.0–0.4)
Eos: 1 %
Hematocrit: 43.9 % (ref 34.0–46.6)
Hemoglobin: 15.3 g/dL (ref 11.1–15.9)
Immature Grans (Abs): 0.1 10*3/uL (ref 0.0–0.1)
Immature Granulocytes: 1 %
Lymphocytes Absolute: 1.9 10*3/uL (ref 0.7–3.1)
Lymphs: 25 %
MCH: 29.4 pg (ref 26.6–33.0)
MCHC: 34.9 g/dL (ref 31.5–35.7)
MCV: 84 fL (ref 79–97)
Monocytes Absolute: 0.4 10*3/uL (ref 0.1–0.9)
Monocytes: 6 %
Neutrophils Absolute: 5.1 10*3/uL (ref 1.4–7.0)
Neutrophils: 66 %
Platelets: 331 10*3/uL (ref 150–450)
RBC: 5.21 x10E6/uL (ref 3.77–5.28)
RDW: 12.2 % (ref 11.7–15.4)
WBC: 7.6 10*3/uL (ref 3.4–10.8)

## 2022-05-12 LAB — COMPREHENSIVE METABOLIC PANEL
ALT: 26 IU/L (ref 0–32)
AST: 19 IU/L (ref 0–40)
Albumin/Globulin Ratio: 2.7 — ABNORMAL HIGH (ref 1.2–2.2)
Albumin: 4.9 g/dL (ref 3.9–5.0)
Alkaline Phosphatase: 87 IU/L (ref 44–121)
BUN/Creatinine Ratio: 11 (ref 9–23)
BUN: 10 mg/dL (ref 6–20)
Bilirubin Total: 0.7 mg/dL (ref 0.0–1.2)
CO2: 21 mmol/L (ref 20–29)
Calcium: 9.8 mg/dL (ref 8.7–10.2)
Chloride: 102 mmol/L (ref 96–106)
Creatinine, Ser: 0.87 mg/dL (ref 0.57–1.00)
Globulin, Total: 1.8 g/dL (ref 1.5–4.5)
Glucose: 98 mg/dL (ref 70–99)
Potassium: 4.6 mmol/L (ref 3.5–5.2)
Sodium: 138 mmol/L (ref 134–144)
Total Protein: 6.7 g/dL (ref 6.0–8.5)
eGFR: 94 mL/min/{1.73_m2} (ref >59)

## 2022-05-12 LAB — LIPID PANEL
Chol/HDL Ratio: 3.3 ratio (ref 0.0–4.4)
Cholesterol, Total: 167 mg/dL (ref 100–199)
HDL: 50 mg/dL (ref >39)
LDL Chol Calc (NIH): 104 mg/dL — ABNORMAL HIGH (ref 0–99)
Triglycerides: 65 mg/dL (ref 0–149)
VLDL Cholesterol Cal: 13 mg/dL (ref 5–40)

## 2022-05-12 LAB — B12 AND FOLATE PANEL
Folate: 17.4 ng/mL (ref >3.0)
Vitamin B-12: 405 pg/mL (ref 232–1245)

## 2022-05-12 LAB — LYME DISEASE DNA BY PCR(BORRELIA BURG): Lyme (B. burgdorferi) PCR: NEGATIVE

## 2022-05-12 LAB — VITAMIN D 1,25 DIHYDROXY
Vitamin D 1, 25 (OH)2 Total: 48 pg/mL
Vitamin D2 1, 25 (OH)2: <10 pg/mL
Vitamin D3 1, 25 (OH)2: 48 pg/mL

## 2022-05-12 LAB — IRON,TIBC AND FERRITIN PANEL
Ferritin: 87 ng/mL (ref 15–150)
Iron Saturation: 25 % (ref 15–55)
Iron: 90 ug/dL (ref 27–159)
Total Iron Binding Capacity: 355 ug/dL (ref 250–450)
UIBC: 265 ug/dL (ref 131–425)

## 2022-05-12 LAB — HEMOGLOBIN A1C
Est. average glucose Bld gHb Est-mCnc: 100 mg/dL
Hgb A1c MFr Bld: 5.1 % (ref 4.8–5.6)

## 2022-05-12 LAB — ROCKY MTN SPOTTED FVR ABS PNL(IGG+IGM)
RMSF IgG: NEGATIVE
RMSF IgM: 0.28 index (ref 0.00–0.89)

## 2022-05-12 NOTE — Progress Notes (Deleted)
Cardiology Office Note:    Date:  05/12/2022   ID:  Anne Cardenas, DOB Jul 26, 1996, MRN [id number]  PCP:  Orma Render, NP   [facility] Providers Cardiologist:  None {   Referring MD: Orma Render, NP    History of Present Illness:    Anne Cardenas is a 26 y.o. female with a hx of PCOS who was referred by Jacolyn Reedy, NP for further evaluation of tachycardia and presyncopal symptoms.   Patient was seen in the ER on 04/24/22. Note reviewed.  Presented with sudden onset of tachycardia and lightheadedness. Was [facility] by Dr. Owens Shark at [facility] and BG 94, BP 148/1000, HR 113. No associated chest pain or SOB. In the ER, d-dimer negative. TSH normal. UDS negative. CXR unremarkable. ECG with sinus tach. She was discharged home and followed up with her PCP who referred her here.  Today, ***  Past Medical History:  Diagnosis Date   PCOS (polycystic ovarian syndrome)     No past surgical history on file.  Current Medications: No outpatient medications have been marked as taking for the 05/21/22 encounter (Appointment) with Freada Bergeron, MD.     Allergies:   Ciprofloxacin   Social History   Socioeconomic History   Marital status: Single    Spouse name: Not on file   Number of children: Not on file   Years of education: Not on file   Highest education level: Not on file  Occupational History   Not on file  Tobacco Use   Smoking status: Never   Smokeless tobacco: Never  Substance and Sexual Activity   Alcohol use: No   Drug use: No   Sexual activity: Not Currently    Birth control/protection: None  Other Topics Concern   Not on file  Social History Narrative   Not on file   Social Determinants of Health   Financial Resource Strain: Not on file  Food Insecurity: Not on file  Transportation Needs: Not on file  Physical Activity: Not on file  Stress: Not on file  Social Connections: Not on file     Family History: The patient's ***family  history includes ADD / ADHD in her sister and sister; Anxiety disorder in her father and sister; Depression in her sister; Hashimoto's thyroiditis in her sister; Heart disease in her paternal grandfather; Hyperlipidemia in her father; Hypertension in her mother; Stroke in her paternal grandfather.  ROS:   Please see the history of present illness.    *** All other systems reviewed and are negative.  EKGs/Labs/Other Studies Reviewed:    The following studies were reviewed today: ***  EKG:  EKG is *** ordered today.  The ekg ordered today demonstrates ***  Recent Labs: 04/24/2022: ALT 20; BUN 15; Creatinine, Ser 0.95; Hemoglobin 15.6; Platelets 310; Potassium 3.8; Sodium 138; TSH 1.065  Recent Lipid Panel    Component Value Date/Time   LDLDIRECT 98.0 03/28/2021 1554     Risk Assessment/Calculations:   {Does this patient have ATRIAL FIBRILLATION?:[id number]}       Physical Exam:    VS:  There were no vitals taken for this visit.    Wt Readings from Last 3 Encounters:  05/05/22 218 lb 6.4 oz (99.1 kg)  03/28/21 214 lb 12.8 oz (97.4 kg)  01/23/13 150 lb (68 kg) (86 %, Z= 1.09)*   * Growth percentiles are based on CDC (Girls, 2-20 Years) data.     GEN: *** Well nourished, well developed in  no acute distress HEENT: Normal NECK: No JVD; No carotid bruits LYMPHATICS: No lymphadenopathy CARDIAC: ***RRR, no murmurs, rubs, gallops RESPIRATORY:  Clear to auscultation without rales, wheezing or rhonchi  ABDOMEN: Soft, non-tender, non-distended MUSCULOSKELETAL:  No edema; No deformity  SKIN: Warm and dry NEUROLOGIC:  Alert and oriented x 3 PSYCHIATRIC:  Normal affect   ASSESSMENT:    No diagnosis found. PLAN:    In order of problems listed above:  #Tachycardia: #Lightheadedness: Patient with episode of sudden onset tachycardia and lightheadedness prompting her to go to the ER as detailed above. Work-up there reassuring with normal d-dimer, TSH, hemoglobin. CXR  unremarkable. ECG with sinus tach. Since that time, *** -Check zio monitor -Increase hydration -Cut back on caffeine -No tobacco or supplement use      {Are you ordering a CV Procedure (e.g. stress test, cath, DCCV, TEE, etc)?   Press F2        :[id number]}    Medication Adjustments/Labs and Tests Ordered: Current medicines are reviewed at length with the patient today.  Concerns regarding medicines are outlined above.  No orders of the defined types were placed in this encounter.  No orders of the defined types were placed in this encounter.   There are no Patient Instructions on file for this visit.   Signed, Freada Bergeron, MD  05/12/2022 2:54 PM    [facility]

## 2022-05-21 ENCOUNTER — Encounter: Payer: Self-pay | Admitting: Cardiology

## 2022-05-21 ENCOUNTER — Ambulatory Visit (INDEPENDENT_AMBULATORY_CARE_PROVIDER_SITE_OTHER): Payer: 59

## 2022-05-21 ENCOUNTER — Ambulatory Visit: Payer: 59 | Admitting: Cardiology

## 2022-05-21 VITALS — BP 108/74 | HR 86 | Ht 64.0 in | Wt 216.4 lb

## 2022-05-21 DIAGNOSIS — R002 Palpitations: Secondary | ICD-10-CM

## 2022-05-21 DIAGNOSIS — R000 Tachycardia, unspecified: Secondary | ICD-10-CM | POA: Diagnosis not present

## 2022-05-21 DIAGNOSIS — R42 Dizziness and giddiness: Secondary | ICD-10-CM | POA: Diagnosis not present

## 2022-05-21 NOTE — Progress Notes (Unsigned)
Enrolled for [employer] to mail a ZIO XT long term holter monitor to the patients address on file.  

## 2022-05-21 NOTE — Progress Notes (Signed)
Cardiology Office Note:    Date:  05/21/2022   ID:  YANINA KNUPP, DOB 03-10-1996, MRN [id number]  PCP:  Orma Render, NP   [facility] Providers Cardiologist:  None {   Referring MD: Orma Render, NP    History of Present Illness:    Anne Cardenas is a 26 y.o. female with a hx of PCOS who was referred by Jacolyn Reedy, NP for further evaluation of tachycardia and presyncopal symptoms.   Patient was seen in the ER on 04/24/22. Note reviewed.  Presented with sudden onset of tachycardia and lightheadedness. [facility] Dr[facility] Owens [facility] and BG 94, BP 148/1000, HR 113. No associated chest pain or SOB. In the ER, d-dimer negative. TSH normal. UDS negative. CXR unremarkable. ECG with sinus tach. She was discharged home and followed up with her PCP who referred her here.  Today, the patient states that a month ago at work she suddenly felt dizzy with a racing heart rate. These were relatively new symptoms. Her blood pressure was found to be elevated. Her heart rate remained in the 120's for over an hour, so she went to the ED at [facility]. After a couple hours, she developed a fever. At that point her heart rate had been in the 120's-130's for 8 hours. After given medication her heart rate improved to below 100 bpm. Aside from her fever, she denies any other symptoms typical of a viral infection. Since she was seen in the ED, her BP has averaged in the 90's/50-60's at home. She physically felt fine, but was also suffering from severe lightheadedness and fogginess for the first week or two.   For one month prior to her ED visit, she started to become dizzy during her "leg day" workouts at the gym especially with squats. Of note, she has been exercising in this way for at least a year. She denies any pre-syncopal feelings or loss of consciousness.  At this time she feels "mostly fine" but has also noticed a loss of appetite. She notes feeling satiety more quickly. After  standing up from lying down, she feels pounding heart beats. She also reports that sitting up in bed may cause her head to "start pounding." Typically her symptoms are now very brief; several seconds at a time. Overall, however, she believes she is gradually improving each week.  For a time after her ED visit she stopped her spironolactone to determine if she was developing side effects. She did not restart her spironolactone until about 1 week ago. She did not notice much of a difference in her symptoms either on or off the spironolactone.  Typically she does not drink much caffeine or coffee.   In her family, her aunt has Lupus. Her grandfather had a CABG and a stroke. Her mother has hypertension. Her sister has Hashimoto's.  She denies any chest pain, shortness of breath, or peripheral edema. No orthopnea, or PND.   Past Medical History:  Diagnosis Date   Hypotension    Myopia    PCOS (polycystic ovarian syndrome)    Tachycardia     No past surgical history on file.  Current Medications: Current Meds  Medication Sig   aluminum chloride (DRYSOL) 20 % external solution Apply 1 application. topically daily as needed (excessive sweating).   bifidobacterium infantis (ALIGN) capsule Take 1 capsule by mouth daily.   COVID-19 mRNA bivalent vaccine, Pfizer, (PFIZER COVID-19 VAC BIVALENT) injection Inject into the muscle.  ibuprofen (ADVIL) 200 MG tablet Take 400 mg by mouth every 6 (six) hours as needed for headache.   progesterone (PROMETRIUM) 200 MG capsule Take 200 mg by mouth See admin instructions. Takes during day 15 to 28 of cycle.   spironolactone (ALDACTONE) 100 MG tablet Take 100 mg by mouth daily.     Allergies:   Ciprofloxacin   Social History   Socioeconomic History   Marital status: Single    Spouse name: Not on file   Number of children: Not on file   Years of education: Not on file   Highest education level: Not on file  Occupational History   Not on file   Tobacco Use   Smoking status: Never   Smokeless tobacco: Never  Substance and Sexual Activity   Alcohol use: No   Drug use: No   Sexual activity: Not Currently    Birth control/protection: None  Other Topics Concern   Not on file  Social History Narrative   Not on file   Social Determinants of Health   Financial Resource Strain: Not on file  Food Insecurity: Not on file  Transportation Needs: Not on file  Physical Activity: Not on file  Stress: Not on file  Social Connections: Not on file     Family History: The patient's family history includes ADD / ADHD in her sister and sister; Anxiety disorder in her father and sister; Depression in her sister; Hashimoto's thyroiditis in her sister; Heart disease in her paternal grandfather; Hyperlipidemia in her father; Hypertension in her mother; Stroke in her paternal grandfather.  ROS:   Review of Systems  Constitutional:  Negative for chills and fever.  HENT:  Negative for ear discharge and sore throat.   Eyes:  Negative for double vision.  Respiratory:  Negative for cough and sputum production.   Cardiovascular:  Positive for palpitations. Negative for chest pain, orthopnea, claudication, leg swelling and PND.  Gastrointestinal:  Negative for heartburn and nausea.  Genitourinary:  Negative for hematuria.  Musculoskeletal:  Negative for falls.  Neurological:  Positive for dizziness and headaches. Negative for tremors.  Endo/Heme/Allergies:  Negative for polydipsia.  Psychiatric/Behavioral:  Negative for depression and suicidal ideas.     EKGs/Labs/Other Studies Reviewed:    The following studies were reviewed today:  US Abdomen 05/09/2022: FINDINGS: Gallbladder: No gallstones or wall thickening visualized. No sonographic Murphy sign noted by sonographer.   Common bile duct: Diameter: 4.5 mm.   Liver: Diffusely increased in echogenicity consistent with fatty infiltration. Portal vein is patent on color Doppler imaging  with normal direction of blood flow towards the liver.   IVC: No abnormality visualized.   Pancreas: Visualized portion unremarkable.   Spleen: Size and appearance within normal limits.   Right Kidney: Length: 11.3 cm. Echogenicity within normal limits. No mass or hydronephrosis visualized.   Left Kidney: Length: 12.2 cm. Echogenicity within normal limits. No mass or hydronephrosis visualized.   Abdominal aorta: No aneurysm visualized.   Other findings: None.   IMPRESSION: Fatty liver.   No other focal abnormality is noted.  Chest X-Ray 04/24/2022: FINDINGS: The heart size and mediastinal contours are within normal limits. Both lungs are clear. The visualized skeletal structures are unremarkable.   IMPRESSION: No active disease.   EKG:  EKG is personally reviewed. 05/21/2022: EKG was not ordered. 04/24/2022 (ED):  Sinus tachycardia at 112 bpm. Prolonged PR interval.  Recent Labs: 04/24/2022: TSH 1.065 05/05/2022: ALT 26; BUN 10; Creatinine, Ser 0.87; Hemoglobin 15.3; Platelets 331; Potassium  4.6; Sodium 138   Recent Lipid Panel    Component Value Date/Time   CHOL 167 05/05/2022 0900   TRIG 65 05/05/2022 0900   HDL 50 05/05/2022 0900   CHOLHDL 3.3 05/05/2022 0900   LDLCALC 104 (H) 05/05/2022 0900   LDLDIRECT 98.0 03/28/2021 1554     Risk Assessment/Calculations:           Physical Exam:    VS:  BP 108/74   Pulse 86   Ht '5\' 4"'$  (1.626 m)   Wt 216 lb 6.4 oz (98.2 kg)   SpO2 98%   BMI 37.14 kg/m     Wt Readings from Last 3 Encounters:  05/21/22 216 lb 6.4 oz (98.2 kg)  05/05/22 218 lb 6.4 oz (99.1 kg)  03/28/21 214 lb 12.8 oz (97.4 kg)     GEN: Well nourished, well developed in no acute distress HEENT: Normal NECK: No JVD; No carotid bruits CARDIAC: RRR, no murmurs, rubs, gallops RESPIRATORY:  Clear to auscultation without rales, wheezing or rhonchi  ABDOMEN: Soft, non-tender, non-distended MUSCULOSKELETAL:  No edema; No deformity  SKIN: Warm and  dry NEUROLOGIC:  Alert and oriented x 3 PSYCHIATRIC:  Normal affect   ASSESSMENT:    1. Tachycardia   2. Lightheadedness   3. Dizziness   4. Palpitations    PLAN:    In order of problems listed above:  #Tachycardia: #Lightheadedness: Patient with episode of sudden onset tachycardia and lightheadedness prompting her to go to the ER as detailed above. Work-up there reassuring with normal d-dimer, TSH, hemoglobin. CXR unremarkable. ECG with sinus tach. Notably had a fever and elevated WBC at that time. Infectious work-up has been unremarkable. Overall, symptoms are slowly improving but the lightheadedness/rapid heart rates have persisted especially with position changes and certain exercises such as squats. Orthostatics negative in the office today.  -Check zio monitor -Check TTE -Increase hydration including electrolyte replacement drinks -Liberalize salt  -No significant caffeine use -No tobacco or supplement use         Follow-up:  4monthfollow-up  Medication Adjustments/Labs and Tests Ordered: Current medicines are reviewed at length with the patient today.  Concerns regarding medicines are outlined above.   Orders Placed This Encounter  Procedures   LONG TERM MONITOR (3-14 DAYS)   ECHOCARDIOGRAM COMPLETE   No orders of the defined types were placed in this encounter.  Patient Instructions  Medication Instructions:   Your physician recommends that you continue on your current medications as directed. Please refer to the Current Medication list given to you today.  *If you need a refill on your cardiac medications before your next appointment, please call your pharmacy*  Testing/Procedures:  Your physician has requested that you have an echocardiogram. Echocardiography is a painless test that uses sound waves to create images of your heart. It provides your doctor with information about the size and shape of your heart and how well your heart's chambers and valves  are working. This procedure takes approximately one hour. There are no restrictions for this procedure.  ZIO XT- Long Term Monitor Instructions  Your physician has requested you wear a ZIO patch monitor for 7 days.  This is a single patch monitor. [employer] supplies one patch monitor per enrollment. Additional stickers are not available. Please do not apply patch if you will be having a Nuclear Stress Test,  Echocardiogram, Cardiac CT, MRI, or Chest Xray during the period you would be wearing the  monitor. The patch cannot be worn during these  tests. You cannot remove and re-apply the  ZIO XT patch monitor.  Your ZIO patch monitor will be mailed 3 day USPS to your address on file. It may take 3-5 days  to receive your monitor after you have been enrolled.  Once you have received your monitor, please review the enclosed instructions. Your monitor  has already been registered assigning a specific monitor serial # to you.  Billing and Patient Assistance Program Information  We have supplied [employer] with any of your insurance information on file for billing purposes. [employer] offers a sliding scale Patient Assistance Program for patients that do not have  insurance, or whose insurance does not completely cover the cost of the ZIO monitor.  You must apply for the Patient Assistance Program to qualify for this discounted rate.  To apply, please call [employer] at [phone], select option 4, select option 2, ask to apply for  Patient Assistance Program. Theodore Demark will ask your household income, and how many people  are in your household. They will quote your out-of-pocket cost based on that information.  [employer] will also be able to set up a 68-month interest-free payment plan if needed.  Applying the monitor   Shave hair from upper left chest.  Hold abrader disc by orange tab. Rub abrader in 40 strokes over the upper left chest as  indicated in your monitor instructions.  Clean area with 4  enclosed alcohol pads. Let dry.  Apply patch as indicated in monitor instructions. Patch will be placed under collarbone on left  side of chest with arrow pointing upward.  Rub patch adhesive wings for 2 minutes. Remove white label marked "1". Remove the white  label marked "2". Rub patch adhesive wings for 2 additional minutes.  While looking in a mirror, press and release button in center of patch. A small green light will  flash 3-4 times. This will be your only indicator that the monitor has been turned on.  Do not shower for the first 24 hours. You may shower after the first 24 hours.  Press the button if you feel a symptom. You will hear a small click. Record Date, Time and  Symptom in the Patient Logbook.  When you are ready to remove the patch, follow instructions on the last 2 pages of Patient  Logbook. Stick patch monitor onto the last page of Patient Logbook.  Place Patient Logbook in the blue and white box. Use locking tab on box and tape box closed  securely. The blue and white box has prepaid postage on it. Please place it in the mailbox as  soon as possible. Your physician should have your test results approximately 7 days after the  [facility]  Call IMcHenryat [phone] you have questions regarding  your ZIO XT patch monitor. Call them immediately if you see an orange light blinking on your  monitor.  If your monitor falls off in less than 4 days, contact our Monitor department at [phone]  If your monitor becomes loose or falls off after 4 days call [employer] at [phone]  suggestions on securing your monitor   Follow-Up:  3 MONTHS IN THE OFFICE WITH DR. PJohney Frame   Important Information About Sugar         I,Mathew Stumpf,acting as a scribe for HFreada Bergeron MD.,have documented all relevant documentation on the behalf of HFreada Bergeron MD,as directed by  HFreada Bergeron MD while  in the presence of Freada Bergeron, MD.  I, Freada Bergeron, MD, have reviewed all documentation for this visit. The documentation on 05/21/22 for the exam, diagnosis, procedures, and orders are all accurate and complete.   Signed, Freada Bergeron, MD  05/21/2022 8:56 AM    [place]

## 2022-05-21 NOTE — Patient Instructions (Addendum)
Medication Instructions:   Your physician recommends that you continue on your current medications as directed. Please refer to the Current Medication list given to you today.  *If you need a refill on your cardiac medications before your next appointment, please call your pharmacy*  Testing/Procedures:  Your physician has requested that you have an echocardiogram. Echocardiography is a painless test that uses sound waves to create images of your heart. It provides your doctor with information about the size and shape of your heart and how well your heart's chambers and valves are working. This procedure takes approximately one hour. There are no restrictions for this procedure.  ZIO XT- Long Term Monitor Instructions  Your physician has requested you wear a ZIO patch monitor for 7 days.  This is a single patch monitor. [employer] supplies one patch monitor per enrollment. Additional stickers are not available. Please do not apply patch if you will be having a Nuclear Stress Test,  Echocardiogram, Cardiac CT, MRI, or Chest Xray during the period you would be wearing the  monitor. The patch cannot be worn during these tests. You cannot remove and re-apply the  ZIO XT patch monitor.  Your ZIO patch monitor will be mailed 3 day USPS to your address on file. It may take 3-5 days  to receive your monitor after you have been enrolled.  Once you have received your monitor, please review the enclosed instructions. Your monitor  has already been registered assigning a specific monitor serial # to you.  Billing and Patient Assistance Program Information  We have supplied [employer] with any of your insurance information on file for billing purposes. [employer] offers a sliding scale Patient Assistance Program for patients that do not have  insurance, or whose insurance does not completely cover the cost of the ZIO monitor.  You must apply for the Patient Assistance Program to qualify for this discounted  rate.  To apply, please call [employer] at [phone], select option 4, select option 2, ask to apply for  Patient Assistance Program. Theodore Demark will ask your household income, and how many people  are in your household. They will quote your out-of-pocket cost based on that information.  [employer] will also be able to set up a 61-month interest-free payment plan if needed.  Applying the monitor   Shave hair from upper left chest.  Hold abrader disc by orange tab. Rub abrader in 40 strokes over the upper left chest as  indicated in your monitor instructions.  Clean area with 4 enclosed alcohol pads. Let dry.  Apply patch as indicated in monitor instructions. Patch will be placed under collarbone on left  side of chest with arrow pointing upward.  Rub patch adhesive wings for 2 minutes. Remove white label marked "1". Remove the white  label marked "2". Rub patch adhesive wings for 2 additional minutes.  While looking in a mirror, press and release button in center of patch. A small green light will  flash 3-4 times. This will be your only indicator that the monitor has been turned on.  Do not shower for the first 24 hours. You may shower after the first 24 hours.  Press the button if you feel a symptom. You will hear a small click. Record Date, Time and  Symptom in the Patient Logbook.  When you are ready to remove the patch, follow instructions on the last 2 pages of Patient  Logbook. Stick patch monitor onto the last page of Patient Logbook.  Place Patient Logbook in  the blue and white box. Use locking tab on box and tape box closed  securely. The blue and white box has prepaid postage on it. Please place it in the mailbox as  soon as possible. Your physician should have your test results approximately 7 days after the  monitor has been [facility] to [facility].  Call [place] at [phone] if you have questions regarding  your ZIO XT patch monitor. Call them  immediately if you see an orange light blinking on your  monitor.  If your monitor falls off in less than 4 days, contact our Monitor department at [phone].  If your monitor becomes loose or falls off after 4 days call [employer] at [phone] for  suggestions on securing your monitor   Follow-Up:  3 MONTHS IN THE OFFICE WITH DR. Johney Frame    Important Information About Sugar

## 2022-05-27 DIAGNOSIS — R000 Tachycardia, unspecified: Secondary | ICD-10-CM | POA: Diagnosis not present

## 2022-05-27 DIAGNOSIS — R42 Dizziness and giddiness: Secondary | ICD-10-CM

## 2022-05-27 DIAGNOSIS — R002 Palpitations: Secondary | ICD-10-CM | POA: Diagnosis not present

## 2022-06-10 ENCOUNTER — Ambulatory Visit (HOSPITAL_COMMUNITY): Payer: 59 | Attending: Cardiology

## 2022-06-10 DIAGNOSIS — R002 Palpitations: Secondary | ICD-10-CM | POA: Diagnosis not present

## 2022-06-10 DIAGNOSIS — R42 Dizziness and giddiness: Secondary | ICD-10-CM | POA: Insufficient documentation

## 2022-06-10 DIAGNOSIS — R000 Tachycardia, unspecified: Secondary | ICD-10-CM | POA: Diagnosis not present

## 2022-06-10 LAB — ECHOCARDIOGRAM COMPLETE
Area-P 1/2: 9.25 cm2
S' Lateral: 2.7 cm

## 2022-06-11 ENCOUNTER — Telehealth: Payer: Self-pay | Admitting: Cardiology

## 2022-06-11 NOTE — Telephone Encounter (Signed)
-----   Message from Freada Bergeron, MD sent at 06/11/2022  9:25 AM EDT ----- Her echo looks great with normal pumping function and no significant valve disease. This is great news!

## 2022-06-11 NOTE — Telephone Encounter (Signed)
The patient has been notified of the result and verbalized understanding.  All questions (if any) were answered. Nuala Alpha, LPN 01/20/4824 00:37 AM

## 2022-06-11 NOTE — Telephone Encounter (Signed)
Patient returned call for her echo results.  

## 2022-08-12 NOTE — Progress Notes (Signed)
Cardiology Office Note:    Date:  08/21/2022   ID:  Anne Cardenas, DOB [birth year], MRN [id number]  PCP:  Donnetta Hutching Coralee Pesa, NP   [facility] Providers Cardiologist:  None {   Referring MD: Orma Render, NP    History of Present Illness:    Anne Cardenas is a 26 y.o. female with a hx of PCOS who was initially seen in clinic for tachycardia and presyncope who now returns to clinic for follow-up.  Patient was seen in the ER on 04/24/22 where she presented with sudden onset of tachycardia and lightheadedness. Was [facility] Dr. Owens [facility] and BG 94, BP 148/100, HR 113. No associated chest pain or SOB. In the ER, d-dimer negative. TSH normal. UDS negative. CXR unremarkable. ECG with sinus tach. She was discharged home.  Was initially seen by [place] on 05/21/22 where she was having episodes of dizziness, presyncope and tachycardia. These symptoms would occur during weight lifting especially when she was doing squats concerning for possible orthostatic symptoms. TTE 06/10/22 with LVEF 60-65%, no valve disease. Cardiac monitor 06/10/22 with no arrhythmias or significant ectopy. Triggered events correlated with sinus tachycardia that was present at rest and with exercise.  Today, the patient overall feels well. She states that she has been placing salt in her water and her dizziness and palpitations have significantly improved. She is able to workout without issue. No chest pain, SOB, LE edema, orthopnea or PND.   Problem List: -Tachycardia and Dizziness  TTE 6/23: EF 60-65%, no valve disease  Cardiac monitor 05/2022: NSR, no arrhythmias or significant ectopy   Past Medical History:  Diagnosis Date   Hypotension    Myopia    PCOS (polycystic ovarian syndrome)    Tachycardia     No past surgical history on file.  Current Medications: Current Meds  Medication Sig   aluminum chloride (DRYSOL) 20 % external solution Apply 1 application. topically daily as needed  (excessive sweating).   bifidobacterium infantis (ALIGN) capsule Take 1 capsule by mouth daily.   COVID-19 mRNA bivalent vaccine, Pfizer, (PFIZER COVID-19 VAC BIVALENT) injection Inject into the muscle.   ibuprofen (ADVIL) 200 MG tablet Take 400 mg by mouth every 6 (six) hours as needed for headache.   progesterone (PROMETRIUM) 200 MG capsule Take 200 mg by mouth See admin instructions. Takes during day 15 to 28 of cycle.   spironolactone (ALDACTONE) 100 MG tablet Take 100 mg by mouth daily.     Allergies:   Ciprofloxacin   Social History   Socioeconomic History   Marital status: Single    Spouse name: Not on file   Number of children: Not on file   Years of education: Not on file   Highest education level: Not on file  Occupational History   Not on file  Tobacco Use   Smoking status: Never   Smokeless tobacco: Never  Substance and Sexual Activity   Alcohol use: No   Drug use: No   Sexual activity: Not Currently    Birth control/protection: None  Other Topics Concern   Not on file  Social History Narrative   Not on file   Social Determinants of Health   Financial Resource Strain: Not on file  Food Insecurity: Not on file  Transportation Needs: Not on file  Physical Activity: Not on file  Stress: Not on file  Social Connections: Not on file     Family History: The patient's family history includes ADD /  ADHD in her sister and sister; Anxiety disorder in her father and sister; Depression in her sister; Hashimoto's thyroiditis in her sister; Heart disease in her paternal grandfather; Hyperlipidemia in her father; Hypertension in her mother; Stroke in her paternal grandfather.  ROS:   Review of Systems  HENT:  Negative for ear discharge and sore throat.   Eyes:  Negative for double vision.  Respiratory:  Negative for cough and sputum production.   Cardiovascular:  Negative for chest pain, palpitations, orthopnea, claudication, leg swelling and PND.  Gastrointestinal:   Negative for heartburn and nausea.  Genitourinary:  Negative for hematuria.  Musculoskeletal:  Negative for falls.  Neurological:  Negative for dizziness, tremors and headaches.  Endo/Heme/Allergies:  Negative for polydipsia.  Psychiatric/Behavioral:  Negative for depression and suicidal ideas.      EKGs/Labs/Other Studies Reviewed:    The following studies were reviewed today: Cardiac Monitor:  06/10/22:   Patch wear time was 6 days and 23 hours Rare ectopy with <1% SVE, <1% VE One brief episode of Mobitz I with sleep Patient triggered events correlate with sinus tachycardia that was present both at rest and with exercise No sustained arrhythmias or significant pauses     Patch Wear Time:  6 days and 23 hours (2023-05-30T22:15:45-0400 to 2023-06-06T21:16:47-0400)   Patient had a min HR of 30 bpm, max HR of 175 bpm, and avg HR of 93 bpm. Predominant underlying rhythm was Sinus Rhythm. First Degree AV Block was present. Second Degree AV Block-Mobitz I (Wenckebach) was present. Isolated SVEs were rare (<1.0%), and no  SVE Couplets or SVE Triplets were present. Isolated VEs were rare (<1.0%), and no VE Couplets or VE Triplets were present.  TTE 06/10/22: IMPRESSIONS     1. Left ventricular ejection fraction, by estimation, is 60 to 65%. The  left ventricle has normal function. The left ventricle has no regional  wall motion abnormalities. Left ventricular diastolic parameters were  normal.   2. Right ventricular systolic function is normal. The right ventricular  size is normal.   3. The mitral valve is normal in structure. No evidence of mitral valve  regurgitation. No evidence of mitral stenosis.   4. The aortic valve is tricuspid. Aortic valve regurgitation is not  visualized. No aortic stenosis is present.   5. The inferior vena cava is normal in size with greater than 50%  respiratory variability, suggesting right atrial pressure of 3 mmHg.   Comparison(s): No prior  Echocardiogram.   Conclusion(s)/Recommendation(s): Normal biventricular function without  evidence of hemodynamically significant valvular heart disease.     US Abdomen 05/09/2022: FINDINGS: Gallbladder: No gallstones or wall thickening visualized. No sonographic Murphy sign noted by sonographer.   Common bile duct: Diameter: 4.5 mm.   Liver: Diffusely increased in echogenicity consistent with fatty infiltration. Portal vein is patent on color Doppler imaging with normal direction of blood flow towards the liver.   IVC: No abnormality visualized.   Pancreas: Visualized portion unremarkable.   Spleen: Size and appearance within normal limits.   Right Kidney: Length: 11.3 cm. Echogenicity within normal limits. No mass or hydronephrosis visualized.   Left Kidney: Length: 12.2 cm. Echogenicity within normal limits. No mass or hydronephrosis visualized.   Abdominal aorta: No aneurysm visualized.   Other findings: None.   IMPRESSION: Fatty liver.   No other focal abnormality is noted.  Chest X-Ray 04/24/2022: FINDINGS: The heart size and mediastinal contours are within normal limits. Both lungs are clear. The visualized skeletal structures are unremarkable.  IMPRESSION: No active disease.   EKG:  No new tracing  Recent Labs: 04/24/2022: TSH 1.065 05/05/2022: ALT 26; BUN 10; Creatinine, Ser 0.87; Hemoglobin 15.3; Platelets 331; Potassium 4.6; Sodium 138   Recent Lipid Panel    Component Value Date/Time   CHOL 167 05/05/2022 0900   TRIG 65 05/05/2022 0900   HDL 50 05/05/2022 0900   CHOLHDL 3.3 05/05/2022 0900   LDLCALC 104 (H) 05/05/2022 0900   LDLDIRECT 98.0 03/28/2021 1554     Risk Assessment/Calculations:           Physical Exam:    VS:  BP 108/70   Pulse 86   Ht '5\' 4"'$  (1.626 m)   Wt 220 lb 6.4 oz (100 kg)   SpO2 99%   BMI 37.83 kg/m     Wt Readings from Last 3 Encounters:  08/21/22 220 lb 6.4 oz (100 kg)  05/21/22 216 lb 6.4 oz (98.2 kg)   05/05/22 218 lb 6.4 oz (99.1 kg)     GEN: Well nourished, well developed in no acute distress HEENT: Normal NECK: No JVD; No carotid bruits CARDIAC: RRR, no murmurs, rubs, gallops RESPIRATORY:  Clear to auscultation without rales, wheezing or rhonchi  ABDOMEN: Soft, non-tender, non-distended MUSCULOSKELETAL:  No edema; No deformity  SKIN: Warm and dry NEUROLOGIC:  Alert and oriented x 3 PSYCHIATRIC:  Normal affect   ASSESSMENT:    1. Tachycardia   2. Lightheadedness   3. Palpitations     PLAN:    In order of problems listed above:  #Tachycardia: #Lightheadedness: Reassuring work-up. TTE with EF 60-65%, no valve disease. Cardiac monitor with no sustained arrhythmias or significant ectopy. Patient triggered events correlated with sinus tachycardia. TSH and hemoglobin normal. D-dimer negative. Overall, symptoms significantly improved with increased salt intake.  -Reassuring CV work-up -Increase hydration including electrolyte replacement drinks -Liberalize salt  -No significant caffeine use -No tobacco or supplement use      Medication Adjustments/Labs and Tests Ordered: Current medicines are reviewed at length with the patient today.  Concerns regarding medicines are outlined above.   No orders of the defined types were placed in this encounter.  No orders of the defined types were placed in this encounter.  There are no Patient Instructions on file for this visit.     Signed, Freada Bergeron, MD  08/21/2022 8:20 AM    [facility]

## 2022-08-21 ENCOUNTER — Encounter: Payer: Self-pay | Admitting: Cardiology

## 2022-08-21 ENCOUNTER — Ambulatory Visit: Payer: 59 | Admitting: Cardiology

## 2022-08-21 VITALS — BP 108/70 | HR 86 | Ht 64.0 in | Wt 220.4 lb

## 2022-08-21 DIAGNOSIS — R002 Palpitations: Secondary | ICD-10-CM

## 2022-08-21 DIAGNOSIS — R000 Tachycardia, unspecified: Secondary | ICD-10-CM

## 2022-08-21 DIAGNOSIS — R42 Dizziness and giddiness: Secondary | ICD-10-CM

## 2022-08-21 NOTE — Patient Instructions (Signed)
Medication Instructions:   Your physician recommends that you continue on your current medications as directed. Please refer to the Current Medication list given to you today.  *If you need a refill on your cardiac medications before your next appointment, please call your pharmacy*   Follow-Up:   AS NEEDED WITH DR. Johney Frame    Important Information About Sugar

## 2022-08-29 DIAGNOSIS — D485 Neoplasm of uncertain behavior of skin: Secondary | ICD-10-CM | POA: Diagnosis not present

## 2022-08-29 DIAGNOSIS — D225 Melanocytic nevi of trunk: Secondary | ICD-10-CM | POA: Diagnosis not present

## 2022-08-29 DIAGNOSIS — L728 Other follicular cysts of the skin and subcutaneous tissue: Secondary | ICD-10-CM | POA: Diagnosis not present

## 2022-08-29 DIAGNOSIS — L814 Other melanin hyperpigmentation: Secondary | ICD-10-CM | POA: Diagnosis not present

## 2022-09-08 DIAGNOSIS — D485 Neoplasm of uncertain behavior of skin: Secondary | ICD-10-CM | POA: Diagnosis not present

## 2022-09-08 DIAGNOSIS — D2239 Melanocytic nevi of other parts of face: Secondary | ICD-10-CM | POA: Diagnosis not present

## 2022-12-16 DIAGNOSIS — H5213 Myopia, bilateral: Secondary | ICD-10-CM | POA: Diagnosis not present

## 2022-12-16 DIAGNOSIS — H52203 Unspecified astigmatism, bilateral: Secondary | ICD-10-CM | POA: Diagnosis not present

## 2023-01-06 ENCOUNTER — Encounter: Payer: Self-pay | Admitting: Internal Medicine

## 2023-01-11 IMAGING — US US ABDOMEN COMPLETE
1 series · 14 of 25 positions shown · non-contrast
Comparison: None Available.

CLINICAL DATA: Right lower quadrant pain and fevers

EXAM:
ABDOMEN ULTRASOUND COMPLETE

[Series 1: us abdomen complete · 0.26mm/px · 14 of 84 slices shown]
[im 1/84]
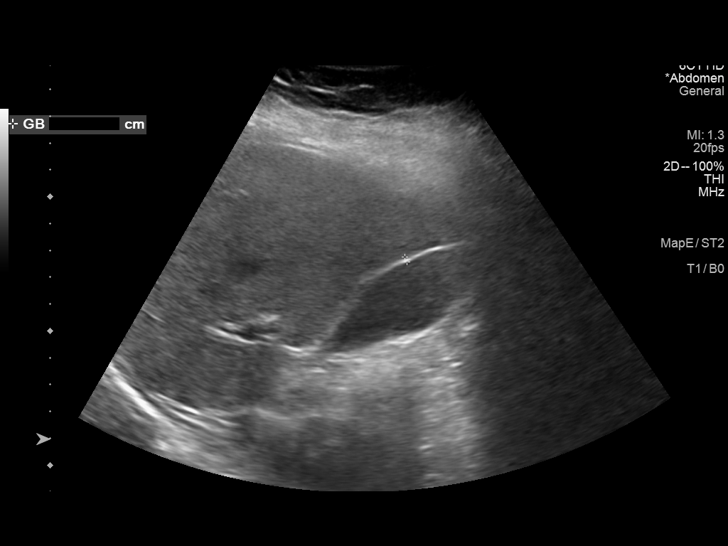
[im 7/84]
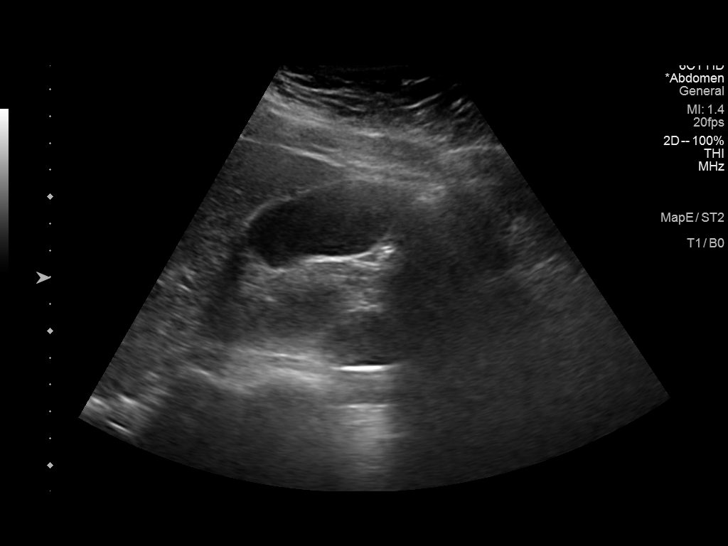
[im 14/84]
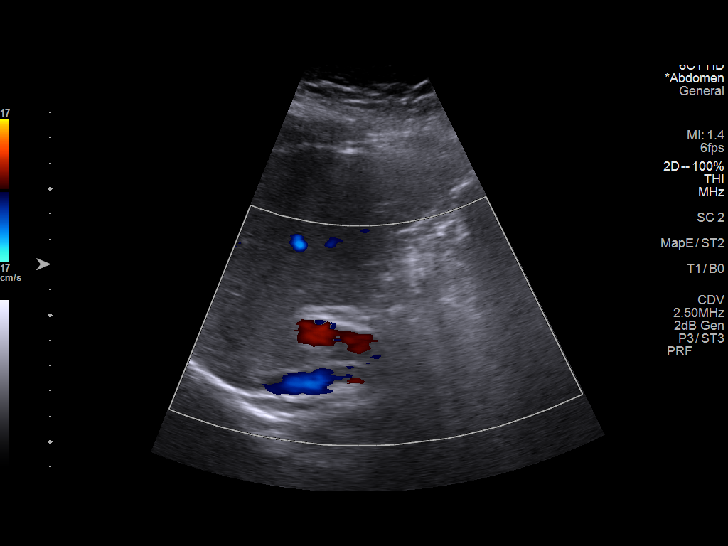
[im 21/84]
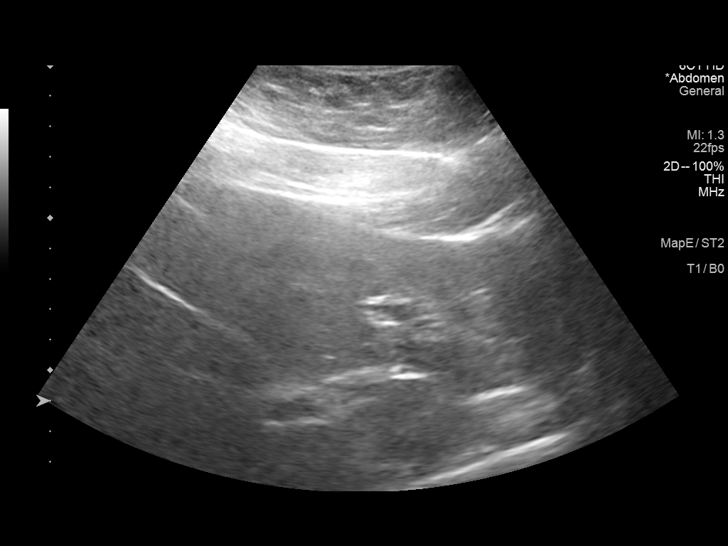
[im 28/84]
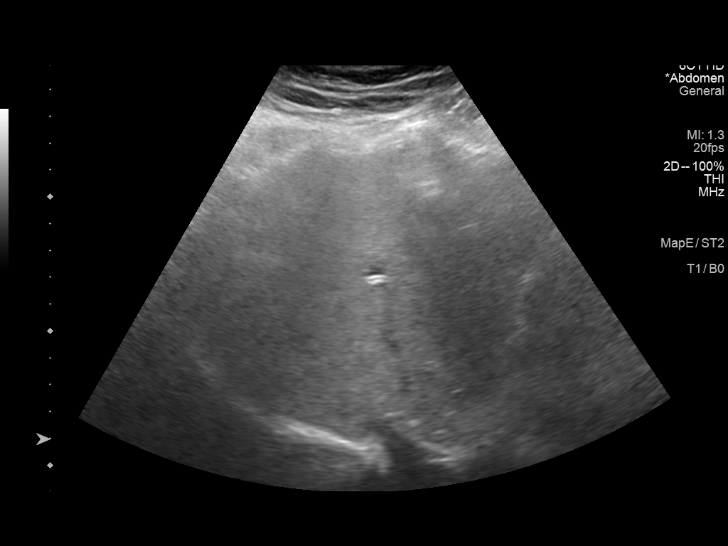
[im 32/84]
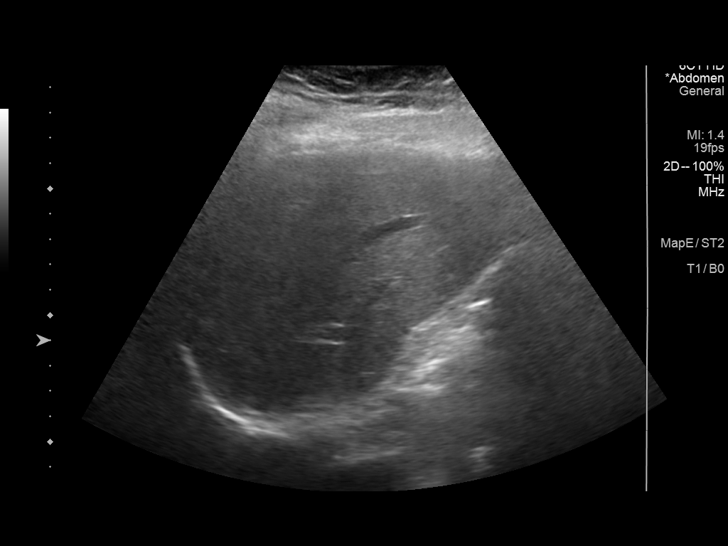
[im 39/84]
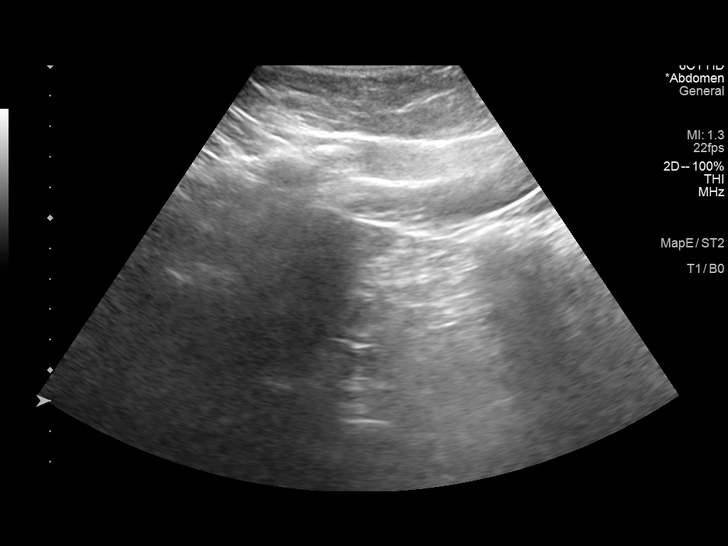
[im 45/84]
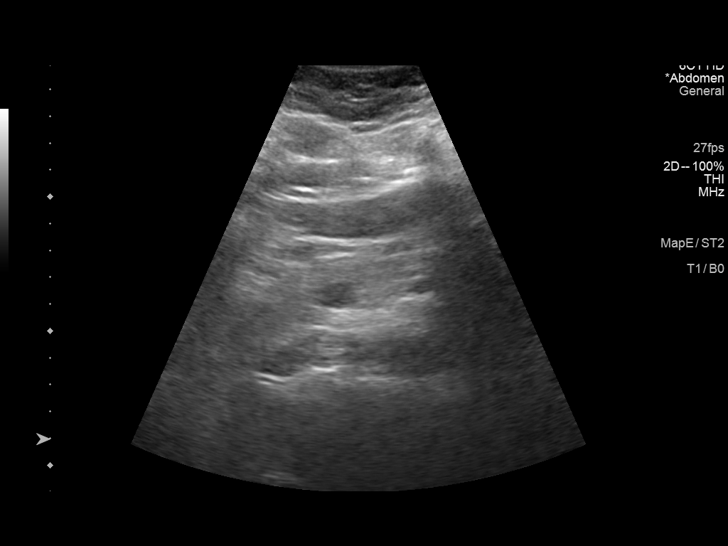
[im 52/84]
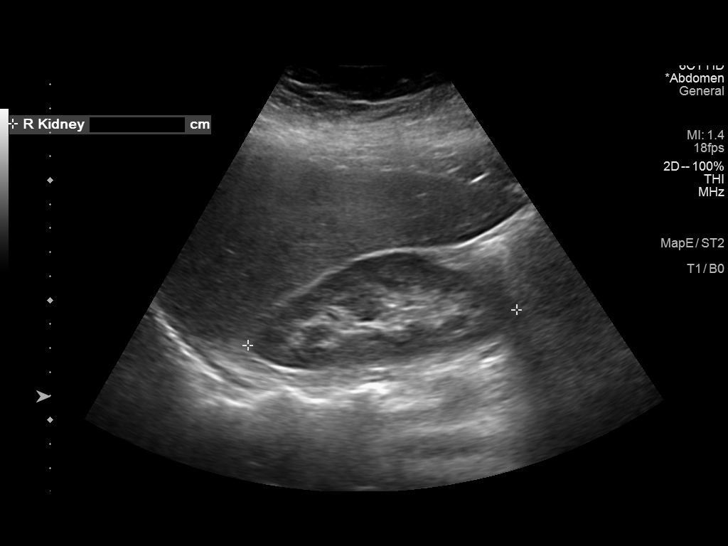
[im 56/84]
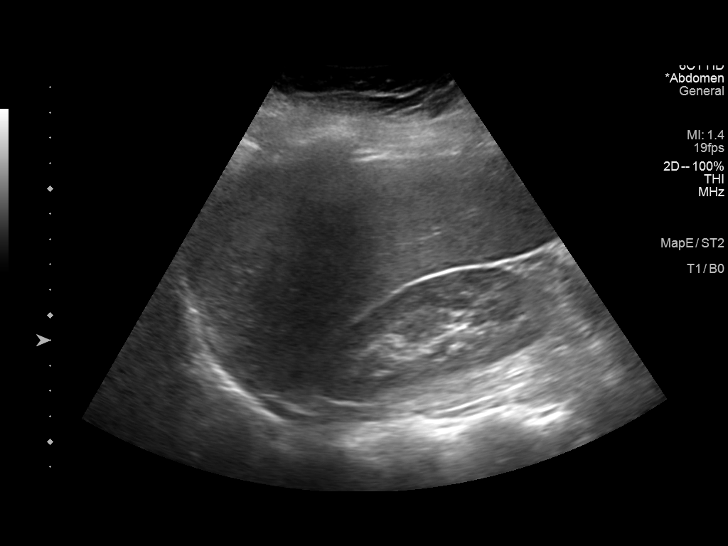
[im 63/84]
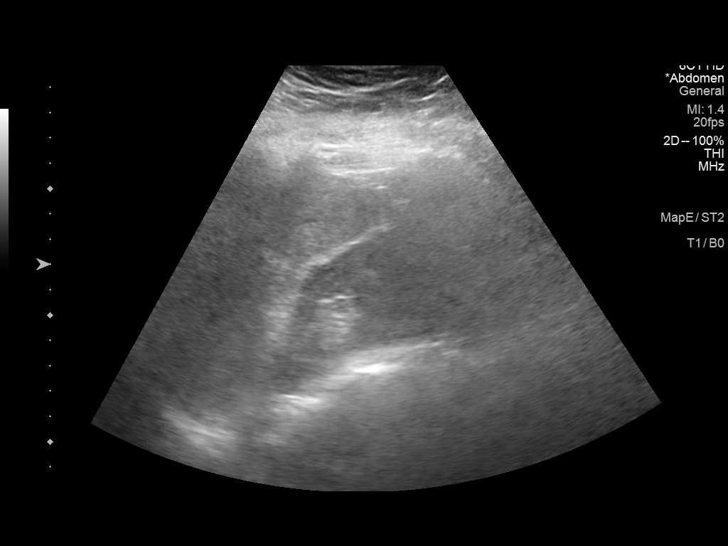
[im 70/84]
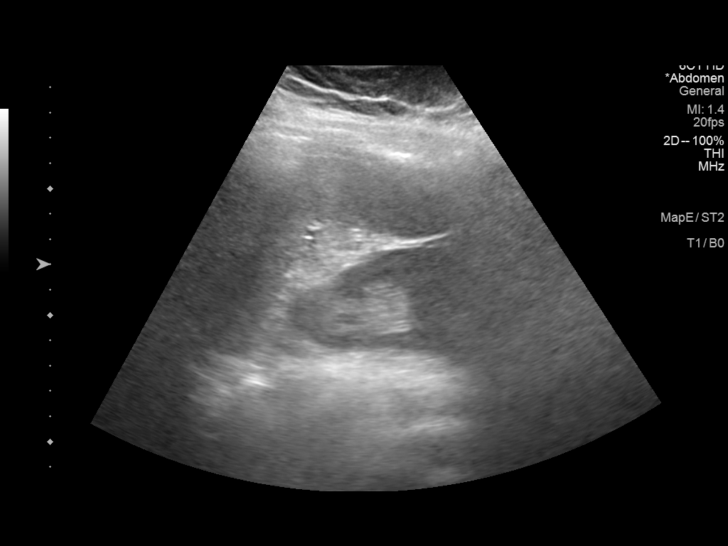
[im 77/84]
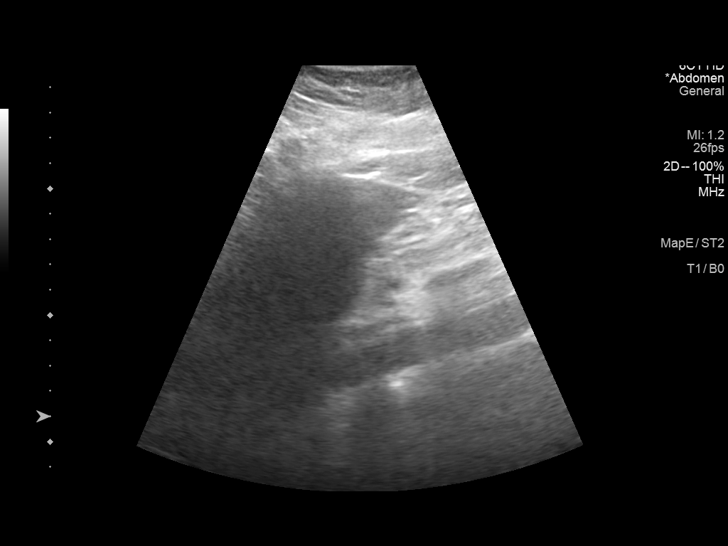
[im 84/84]
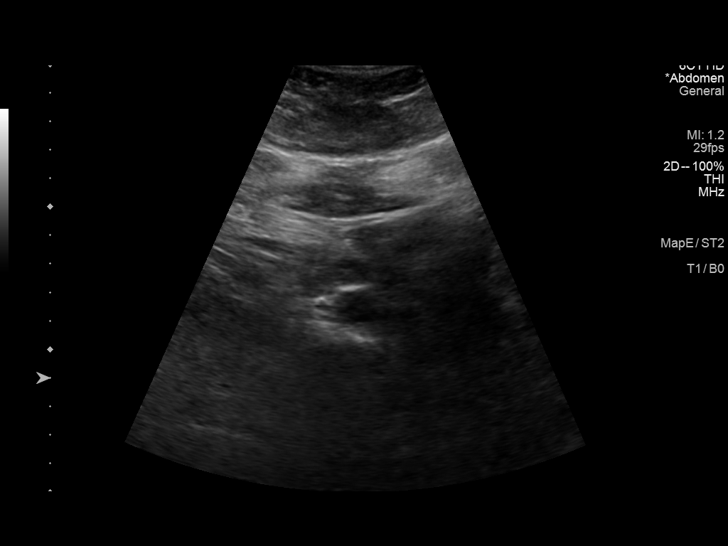

[14 of 25 positions shown; findings below may reference images not displayed]

FINDINGS: Gallbladder: No gallstones or wall thickening visualized. No
sonographic Murphy sign noted by sonographer.

Common bile duct: Diameter: 4.5 mm.

Liver: Diffusely increased in echogenicity consistent with fatty
infiltration. Portal vein is patent on color Doppler imaging with
normal direction of blood flow towards the liver.

IVC: No abnormality visualized.

Pancreas: Visualized portion unremarkable.

Spleen: Size and appearance within normal limits.

Right Kidney: Length: 11.3 cm. Echogenicity within normal limits.
No mass or hydronephrosis visualized.

Left Kidney: Length: 12.2 cm. Echogenicity within normal limits. No
mass or hydronephrosis visualized.

Abdominal aorta: No aneurysm visualized.

Other findings: None.
IMPRESSION: Fatty liver.

No other focal abnormality is noted.

## 2023-01-22 ENCOUNTER — Other Ambulatory Visit (HOSPITAL_BASED_OUTPATIENT_CLINIC_OR_DEPARTMENT_OTHER): Payer: Self-pay

## 2023-01-22 DIAGNOSIS — Z6836 Body mass index (BMI) 36.0-36.9, adult: Secondary | ICD-10-CM | POA: Diagnosis not present

## 2023-01-22 DIAGNOSIS — E282 Polycystic ovarian syndrome: Secondary | ICD-10-CM | POA: Diagnosis not present

## 2023-01-22 DIAGNOSIS — Z01419 Encounter for gynecological examination (general) (routine) without abnormal findings: Secondary | ICD-10-CM | POA: Diagnosis not present

## 2023-01-22 MED ORDER — SPIRONOLACTONE 100 MG PO TABS
100.0000 mg | ORAL_TABLET | Freq: Every day | ORAL | 3 refills | Status: DC
  Filled 2023-01-22: qty 80, 80d supply, fill #0
  Filled 2023-01-22: qty 10, 10d supply, fill #0
  Filled 2023-05-11: qty 90, 90d supply, fill #1
  Filled 2023-08-23: qty 90, 90d supply, fill #2
  Filled 2023-11-18: qty 90, 90d supply, fill #3

## 2023-05-11 ENCOUNTER — Other Ambulatory Visit (HOSPITAL_BASED_OUTPATIENT_CLINIC_OR_DEPARTMENT_OTHER): Payer: Self-pay

## 2023-06-26 ENCOUNTER — Encounter: Payer: Self-pay | Admitting: Nurse Practitioner

## 2023-06-26 ENCOUNTER — Ambulatory Visit: Payer: Commercial Managed Care - PPO | Admitting: Nurse Practitioner

## 2023-06-26 VITALS — BP 118/74 | HR 77 | Ht 64.5 in | Wt 214.8 lb

## 2023-06-26 DIAGNOSIS — E282 Polycystic ovarian syndrome: Secondary | ICD-10-CM | POA: Diagnosis not present

## 2023-06-26 DIAGNOSIS — Z0000 Encounter for general adult medical examination without abnormal findings: Secondary | ICD-10-CM

## 2023-06-26 DIAGNOSIS — L988 Other specified disorders of the skin and subcutaneous tissue: Secondary | ICD-10-CM

## 2023-06-26 DIAGNOSIS — Z6836 Body mass index (BMI) 36.0-36.9, adult: Secondary | ICD-10-CM

## 2023-06-26 DIAGNOSIS — G901 Familial dysautonomia [Riley-Day]: Secondary | ICD-10-CM

## 2023-06-26 LAB — TESTOSTERONE, FREE, TOTAL, SHBG

## 2023-06-26 NOTE — Patient Instructions (Signed)
WEIGHT LOSS PLANNING Your progress today shows:     06/26/2023   10:26 AM 08/21/2022    7:57 AM 05/21/2022    7:56 AM  Vitals with BMI  Height 5' 4.5" 5\' 4"  5\' 4"   Weight 214 lbs 13 oz 220 lbs 6 oz 216 lbs 6 oz  BMI 36.31 37.81 37.13  Systolic 118 108 096  Diastolic 74 70 74  Pulse 77 86 86    For best management of weight, it is vital to balance intake versus output. This means the number of calories burned per day must be less than the calories you take in with food and drink.   I recommend trying to follow a diet with the following: Calories: 1200-1500 calories per day Carbohydrates: 150-180 grams of carbohydrates per day  Why: Gives your body enough "quick fuel" for cells to maintain normal function without sending them into starvation mode.  Protein: At least 90 grams of protein per day- 30 grams with each meal Why: Protein takes longer and uses more energy than carbohydrates to break down for fuel. The carbohydrates in your meals serves as quick energy sources and proteins help use some of that extra quick energy to break down to produce long term energy. This helps you not feel hungry as quickly and protein breakdown burns calories.  Water: Drink AT LEAST 64 ounces of water per day  Why: Water is essential to healthy metabolism. Water helps to fill the stomach and keep you fuller longer. Water is required for healthy digestion and filtering of waste in the body.  Fat: Limit fats in your diet- when choosing fats, choose foods with lower fats content such as lean meats (chicken, fish, [place]).  Why: Increased fat intake leads to storage "for later". Once you burn your carbohydrate energy, your body goes into fat and protein breakdown mode to help you loose weight.  Cholesterol: Fats and oils that are LIQUID at room temperature are best. Choose vegetable oils (olive oil, avocado oil, nuts). Avoid fats that are SOLID at room temperature (animal fats, processed meats). Healthy fats are  often found in whole grains, beans, nuts, seeds, and berries.  Why: Elevated cholesterol levels lead to build up of cholesterol on the inside of your blood vessels. This will eventually cause the blood vessels to become hard and can lead to high blood pressure and damage to your organs. When the blood flow is reduced, but the pressure is high from cholesterol buildup, parts of the cholesterol can break off and form clots that can go to the brain or heart leading to a stroke or heart attack.  Fiber: Increase amount of SOLUBLE the fiber in your diet. This helps to fill you up, lowers cholesterol, and helps with digestion. Some foods high in soluble fiber are oats, peas, beans, apples, carrots, barley, and citrus fruits.   Why: Fiber fills you up, helps remove excess cholesterol, and aids in healthy digestion which are all very important in weight management.   I recommend the following as a minimum activity routine: Purposeful walk or other physical activity at least 20 minutes every single day. This means purposefully taking a walk, jog, bike, swim, treadmill, elliptical, dance, etc.  This activity should be ABOVE your normal daily activities, such as walking at work. Goal exercise should be at least 150 minutes a week- work your way up to this.   Heart Rate: Your maximum exercise heart rate should be 220 - Your Age in Years. When exercising, get  your heart rate up, but avoid going over the maximum targeted heart rate.  60-70% of your maximum heart rate is where you tend to burn the most fat. To find this number:  220 - Age In Years= Max HR  Max HR x 0.6 (or 0.7) = Fat Burning HR The Fat Burning HR is your goal heart rate while working out to burn the most fat.  NEVER exercise to the point your feel lightheaded, weak, nauseated, dizzy. If you experience ANY of these symptoms- STOP exercise! Allow yourself to cool down and your heart rate to come down. Then restart slower next time.  If at ANY TIME  you feel chest pain or chest pressure during exercise, STOP IMMEDIATELY and seek medical attention.

## 2023-06-26 NOTE — Progress Notes (Signed)
Anne Clamp, DNP, AGNP-c [facility] [address] [place], [place] [address] Main Office [phone]  BP 118/74   Pulse 77   Ht 5' 4.5" (1.638 m)   Wt 214 lb 12.8 oz (97.4 kg)   LMP 06/12/2023   BMI 36.30 kg/m    Subjective:    Patient ID: Anne Cardenas, female    DOB: Jan 22, 1996, 27 y.o.   MRN: [id number]  HPI: Anne Cardenas is a 27 y.o. female presenting on 06/26/2023 for comprehensive medical examination.   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 Despite increased activity, she notes only a slight change in her weight. She expresses a desire to lose weight and improve her overall health.  She mentions a history of fatty liver indicated on a CT scan last year and is concerned about managing her abdominal visceral fat for the health of her organs. Anne Cardenas's weight has decreased from 220 to 214 pounds since last year.  Anne Cardenas discusses her cardiovascular health, noting that her A1C levels are good, but she is concerned about her high LDL levels, although her HDL levels are good. She is interested in exploring more about PCOS management, mentioning the lack of comprehensive data on the condition and its treatment options.  She reflects on her previous cardiovascular symptoms, which have been resolved following a diagnosis of a borderline autonomic disorder, possibly related to POTS. She reports significant improvement after increasing her salt intake, specifically using pink Himalayan salt, which has alleviated her symptoms of dizziness and instability during physical activities. She no longer experiences severe dehydration effects or symptoms that were previously triggered by exercise or illness.   Pertinent items are noted in HPI.  IMMUNIZATIONS:   Flu: Flu vaccine  postponed until flu season Prevnar 13: Prevnar 13 N/A for this patient Prevnar 20: Prevnar 20 N/A for this patient Pneumovax 23: Pneumovax 23 N/A for this patient Vac Shingrix: Shingrix N/A for this patient HPV: HPV N/A for this patient Tetanus: Tetanus completed in the last 10 years COVID: COVID completed, documentation in chart   HEALTH MAINTENANCE: Pap Smear HM Status: is up to date Saw GYN in January- [facility]- due next year Mammogram HM Status: is not applicable for this patient Colon Cancer Screening HM Status: is not applicable for this patient Bone Density HM Status: is not applicable for this patient STI Testing HM Status: was declined  Lung CT HM Status: is not applicable for this patient  She reports regular vision exams q1-5y: Yes  She reports regular dental exams q 31m:  Yes  The patient eats a regular, healthy diet. She endorses exercise and/or activity of: Moderate 30 min 3-4x/wk    Most Recent Depression Screen:     06/26/2023   10:26 AM 05/05/2022    8:14 AM  Depression screen PHQ 2/9  Decreased Interest 0 0  Down, Depressed, Hopeless 0 0  PHQ - 2 Score 0 0   Most Recent Anxiety Screen:      No data to display         Most Recent Fall Screen:    06/26/2023   10:26 AM 05/05/2022    8:13 AM 03/28/2021    3:18 PM  Fall Risk   Falls in the past year? 0 0 0  Number falls in past  yr: 0 0 0  Injury with Fall? 0 0 0  Risk for fall due to : No Fall Risks No Fall Risks   Follow up Falls evaluation completed Falls evaluation completed;Education provided     Past medical history, surgical history, medications, allergies, family history and social history reviewed with patient today and changes made to appropriate areas of the chart.  Past Medical History:  Past Medical History:  Diagnosis Date   Bowel habit changes 05/09/2022   Hypotension    Myopia    PCOS (polycystic ovarian syndrome)    Right lower quadrant abdominal pain  05/09/2022   Tachycardia    Tachycardia 05/09/2022   Medications:  Current Outpatient Medications on File Prior to Visit  Medication Sig   aluminum chloride (DRYSOL) 20 % external solution Apply 1 application. topically daily as needed (excessive sweating).   bifidobacterium infantis (ALIGN) capsule Take 1 capsule by mouth daily.   ibuprofen (ADVIL) 200 MG tablet Take 400 mg by mouth every 6 (six) hours as needed for headache.   spironolactone (ALDACTONE) 100 MG tablet Take 1 tablet (100 mg total) by mouth daily.   COVID-19 mRNA bivalent vaccine, Pfizer, (PFIZER COVID-19 VAC BIVALENT) injection Inject into the muscle. (Patient not taking: Reported on 06/26/2023)   progesterone (PROMETRIUM) 200 MG capsule Take 200 mg by mouth See admin instructions. Takes during day 15 to 28 of cycle. (Patient not taking: Reported on 06/26/2023)   No current facility-administered medications on file prior to visit.   Surgical History:  No past surgical history on file. Allergies:  Allergies  Allergen Reactions   Ciprofloxacin Anxiety, Anaphylaxis and Other (See Comments)    Elevated heart rate and panicked feeling  Other Reaction(s): other   Family History:  Family History  Problem Relation Age of Onset   Hypertension Mother    Anxiety disorder Father    Hyperlipidemia Father    ADD / ADHD Sister    Depression Sister    Stroke Paternal Grandfather    Heart disease Paternal Grandfather    Hashimoto's thyroiditis Sister    Anxiety disorder Sister    ADD / ADHD Sister        Objective:    BP 118/74   Pulse 77   Ht 5' 4.5" (1.638 m)   Wt 214 lb 12.8 oz (97.4 kg)   LMP 06/12/2023   BMI 36.30 kg/m   Wt Readings from Last 3 Encounters:  06/26/23 214 lb 12.8 oz (97.4 kg)  08/21/22 220 lb 6.4 oz (100 kg)  05/21/22 216 lb 6.4 oz (98.2 kg)    Physical Exam Vitals and nursing note reviewed.  Constitutional:      General: She is not in acute distress.    Appearance: Normal appearance.   HENT:     Head: Normocephalic and atraumatic.     Right Ear: Hearing, tympanic membrane, ear canal and external ear normal.     Left Ear: Hearing, tympanic membrane, ear canal and external ear normal.     Nose: Nose normal.     Right Sinus: No maxillary sinus tenderness or frontal sinus tenderness.     Left Sinus: No maxillary sinus tenderness or frontal sinus tenderness.     Mouth/Throat:     Lips: Pink.     Mouth: Mucous membranes are moist.     Pharynx: Oropharynx is clear.  Eyes:     General: Lids are normal. Vision grossly intact.     Extraocular Movements: Extraocular movements intact.  Conjunctiva/sclera: Conjunctivae normal.     Pupils: Pupils are equal, round, and reactive to light.     Funduscopic exam:    Right eye: Red reflex present.        Left eye: Red reflex present.    Visual Fields: Right eye visual fields normal and left eye visual fields normal.  Neck:     Thyroid: No thyromegaly.     Vascular: No carotid bruit.  Cardiovascular:     Rate and Rhythm: Normal rate and regular rhythm.     Chest Wall: PMI is not displaced.     Pulses: Normal pulses.          Dorsalis pedis pulses are 2+ on the right side and 2+ on the left side.       Posterior tibial pulses are 2+ on the right side and 2+ on the left side.     Heart sounds: Normal heart sounds. No murmur heard. Pulmonary:     Effort: Pulmonary effort is normal. No respiratory distress.     Breath sounds: Normal breath sounds.  Abdominal:     General: Abdomen is flat. Bowel sounds are normal. There is no distension.     Palpations: Abdomen is soft. There is no hepatomegaly, splenomegaly or mass.     Tenderness: There is no abdominal tenderness. There is no right CVA tenderness, left CVA tenderness, guarding or rebound.  Musculoskeletal:        General: Normal range of motion.     Cervical back: Full passive range of motion without pain, normal range of motion and neck supple. No tenderness.     Right lower  leg: No edema.     Left lower leg: No edema.  Feet:     Left foot:     Toenail Condition: Left toenails are normal.  Lymphadenopathy:     Cervical: No cervical adenopathy.     Upper Body:     Right upper body: No supraclavicular adenopathy.     Left upper body: No supraclavicular adenopathy.  Skin:    General: Skin is warm and dry.     Capillary Refill: Capillary refill takes less than 2 seconds.     Nails: There is no clubbing.  Neurological:     General: No focal deficit present.     Mental Status: She is alert and oriented to person, place, and time.     GCS: GCS eye subscore is 4. GCS verbal subscore is 5. GCS motor subscore is 6.     Sensory: Sensation is intact.     Motor: Motor function is intact.     Coordination: Coordination is intact.     Gait: Gait is intact.     Deep Tendon Reflexes: Reflexes are normal and symmetric.  Psychiatric:        Attention and Perception: Attention normal.        Mood and Affect: Mood normal.        Speech: Speech normal.        Behavior: Behavior normal. Behavior is cooperative.        Thought Content: Thought content normal.        Cognition and Memory: Cognition and memory normal.        Judgment: Judgment normal.     Results for orders placed or performed in visit on 06/26/23  CBC with Differential/Platelet  Result Value Ref Range   WBC 9.2 3.4 - 10.8 x10E3/uL   RBC 5.19 3.77 - 5.28 x10E6/uL  Hemoglobin 14.8 11.1 - 15.9 g/dL   Hematocrit 16.1 09.6 - 46.6 %   MCV 86 79 - 97 fL   MCH 28.5 26.6 - 33.0 pg   MCHC 33.0 31.5 - 35.7 g/dL   RDW 04.5 40.9 - 81.1 %   Platelets 290 150 - 450 x10E3/uL   Neutrophils 65 Not Estab. %   Lymphs 26 Not Estab. %   Monocytes 6 Not Estab. %   Eos 1 Not Estab. %   Basos 1 Not Estab. %   Neutrophils Absolute 6.1 1.4 - 7.0 x10E3/uL   Lymphocytes Absolute 2.4 0.7 - 3.1 x10E3/uL   Monocytes Absolute 0.5 0.1 - 0.9 x10E3/uL   EOS (ABSOLUTE) 0.1 0.0 - 0.4 x10E3/uL   Basophils Absolute 0.1 0.0 - 0.2  x10E3/uL   Immature Granulocytes 1 Not Estab. %   Immature Grans (Abs) 0.1 0.0 - 0.1 x10E3/uL  CMP14+EGFR  Result Value Ref Range   Glucose 93 70 - 99 mg/dL   BUN 12 6 - 20 mg/dL   Creatinine, Ser 9.14 0.57 - 1.00 mg/dL   eGFR 782 >95 AO/ZHY/8.65   BUN/Creatinine Ratio 16 9 - 23   Sodium 137 134 - 144 mmol/L   Potassium 4.8 3.5 - 5.2 mmol/L   Chloride 100 96 - 106 mmol/L   CO2 23 20 - 29 mmol/L   Calcium 9.8 8.7 - 10.2 mg/dL   Total Protein 6.7 6.0 - 8.5 g/dL   Albumin 4.6 4.0 - 5.0 g/dL   Globulin, Total 2.1 1.5 - 4.5 g/dL   Bilirubin Total 0.9 0.0 - 1.2 mg/dL   Alkaline Phosphatase 85 44 - 121 IU/L   AST 15 0 - 40 IU/L   ALT 18 0 - 32 IU/L  Lipoprotein A (LPA)  Result Value Ref Range   Lipoprotein (a) 32.7 <75.0 nmol/L  Lipid panel  Result Value Ref Range   Cholesterol, Total 179 100 - 199 mg/dL   Triglycerides 784 0 - 149 mg/dL   HDL 64 >69 mg/dL   VLDL Cholesterol Cal 19 5 - 40 mg/dL   LDL Chol Calc (NIH) 96 0 - 99 mg/dL   Chol/HDL Ratio 2.8 0.0 - 4.4 ratio  DHEA  Result Value Ref Range   Dehydroepiandrosterone (DHEA) 648 31 - 701 ng/dL  FSH/LH  Result Value Ref Range   LH 22.8 mIU/mL   FSH 4.9 mIU/mL  17-Hydroxyprogesterone  Result Value Ref Range   17-OH Progesterone LCMS 106 ng/dL  Testosterone, Free, Total, SHBG  Result Value Ref Range   Testosterone 64 13 - 71 ng/dL   Testosterone, Free 4.1 0.0 - 4.2 pg/mL   Sex Hormone Binding 17.9 (L) 24.6 - 122.0 nmol/L         Assessment & Plan:   Problem List Items Addressed This Visit     PCOS (polycystic ovarian syndrome)    History of PCOS, evidenced by irregular menstrual cycles and testosterone-dominant symptoms. She has stopped progesterone supplements recently to see how this impacts her symptoms. She has concerns about weight and previously diagnosed fatty liver despite improvement of her diet and exercise. Consider possible insulin resistance as contributing to her difficulty in managing her weight.  Labs pending.  Plan: - Continue monitoring PCOS symptoms. - Consider resuming progesterone if menstrual irregularities increase or become problematic. - Discussed potential use of medications like Wegovy for weight management if insurance coverage available. - Monitor liver health and consider interventions for fatty liver. - Encourage continued physical activity and healthy eating to  manage weight and insulin resistance.      Relevant Orders   CBC with Differential/Platelet (Completed)   CMP14+EGFR (Completed)   Lipoprotein A (LPA) (Completed)   Lipid panel (Completed)   DHEA (Completed)   FSH/LH (Completed)   17-Hydroxyprogesterone (Completed)   Testosterone, Free, Total, SHBG (Completed)   Dysautonomia (HCC)    Patient has been diagnosed with a form of dysautonomia similar to POTS, with symptoms exacerbated by dehydration and resolved significantly by increased salt intake. At this time there are no alarm symptoms present and she is doing well.  Plan: - Continue with increased salt intake and close monitoring.  - Encouraged adequate hydration, especially during physical activity or heat exposure.      Annual physical exam - Primary    CPE completed today.   Labs ordered. Will make changes as necessary based on results.  Review of HM activities and recommendations discussed and provided on AVS. Anticipatory guidance, diet, and exercise recommendations provided.  Medications, allergies, and hx reviewed and updated as necessary.  Plan to f/u with CPE in 1 year or sooner for acute/chronic health needs as directed.        Relevant Orders   CBC with Differential/Platelet (Completed)   CMP14+EGFR (Completed)   Lipoprotein A (LPA) (Completed)   Lipid panel (Completed)   DHEA (Completed)   FSH/LH (Completed)   17-Hydroxyprogesterone (Completed)   Testosterone, Free, Total, SHBG (Completed)   Skin macule    Patient has history of moles and recent observation of small mole/polyp  in ear canal. She has a dermatology appointment in September. At this time there are no alarm symptoms present that warrant an urgent need for evaluation.  Plan: - Advise having dermatologist examine ear canal during next visit in September.   - Continue monitoring skin for changes in existing moles or new ones.      Other Visit Diagnoses     BMI 36.0-36.9,adult       Relevant Orders   CBC with Differential/Platelet (Completed)   CMP14+EGFR (Completed)   Lipoprotein A (LPA) (Completed)   Lipid panel (Completed)   DHEA (Completed)   FSH/LH (Completed)   17-Hydroxyprogesterone (Completed)   Testosterone, Free, Total, SHBG (Completed)          Follow up plan: Return in about 1 year (around 06/25/2024) for CPE.  NEXT PREVENTATIVE PHYSICAL DUE IN 1 YEAR.  PATIENT COUNSELING PROVIDED FOR ALL ADULT PATIENTS: A well balanced diet low in saturated fats, cholesterol, and moderation in carbohydrates.  This can be as simple as monitoring portion sizes and cutting back on sugary beverages such as soda and juice to start with.    Daily water consumption of at least 64 ounces.  Physical activity at least 180 minutes per week.  If just starting out, start 10 minutes a day and work your way up.   This can be as simple as taking the stairs instead of the elevator and walking 2-3 laps around the office  purposefully every day.   STD protection, partner selection, and regular testing if high risk.  Limited consumption of alcoholic beverages if alcohol is consumed. For men, I recommend no more than 14 alcoholic beverages per week, spread out throughout the week (max 2 per day). Avoid "binge" drinking or consuming large quantities of alcohol in one setting.  Please let me know if you feel you may need help with reduction or quitting alcohol consumption.   Avoidance of nicotine, if used. Please let me know if you feel  you may need help with reduction or quitting nicotine use.   Daily mental  health attention. This can be in the form of 5 minute daily meditation, prayer, journaling, yoga, reflection, etc.  Purposeful attention to your emotions and mental state can significantly improve your overall wellbeing  and  Health.  Please know that I am here to help you with all of your health care goals and am happy to work with you to find a solution that works best for you.  The greatest advice I have received with any changes in life are to take it one step at a time, that even means if all you can focus on is the next 60 seconds, then do that and celebrate your victories.  With any changes in life, you will have set backs, and that is OK. The important thing to remember is, if you have a set back, it is not a failure, it is an opportunity to try again! Screening Testing Mammogram Every 1 -2 years based on history and risk factors Starting at age 30 Pap Smear Ages 21-39 every 3 years Ages 47-65 every 5 years with HPV testing More frequent testing may be required based on results and history Colon Cancer Screening Every 1-10 years based on test performed, risk factors, and history Starting at age 89 Bone Density Screening Every 2-10 years based on history Starting at age 3 for women Recommendations for men differ based on medication usage, history, and risk factors AAA Screening One time ultrasound Men 72-57 years old who have every smoked Lung Cancer Screening Low Dose Lung CT every 12 months Age 68-80 years with a 30 pack-year smoking history who still smoke or who have quit within the last 15 years   Screening Labs Routine  Labs: Complete Blood Count (CBC), Complete Metabolic Panel (CMP), Cholesterol (Lipid Panel) Every 6-12 months based on history and medications May be recommended more frequently based on current conditions or previous results Hemoglobin A1c Lab Every 3-12 months based on history and previous results Starting at age 74 or earlier with diagnosis of  diabetes, high cholesterol, BMI >26, and/or risk factors Frequent monitoring for patients with diabetes to ensure blood sugar control Thyroid Panel (TSH) Every 6 months based on history, symptoms, and risk factors May be repeated more often if on medication HIV One time testing for all patients 79 and older May be repeated more frequently for patients with increased risk factors or exposure Hepatitis C One time testing for all patients 58 and older May be repeated more frequently for patients with increased risk factors or exposure Gonorrhea, Chlamydia Every 12 months for all sexually active persons 13-24 years Additional monitoring may be recommended for those who are considered high risk or who have symptoms Every 12 months for any woman on birth control, regardless of sexual activity PSA Men 36-47 years old with risk factors Additional screening may be recommended from age 6-69 based on risk factors, symptoms, and history  Vaccine Recommendations Tetanus Booster All adults every 10 years Flu Vaccine All patients 6 months and older every year COVID Vaccine All patients 12 years and older Initial dosing with booster May recommend additional booster based on age and health history HPV Vaccine 2 doses all patients age 33-26 Dosing may be considered for patients over 26 Shingles Vaccine (Shingrix) 2 doses all adults 55 years and older Pneumonia (Pneumovax 23) All adults 65 years and older May recommend earlier dosing based on health history One year apart from [place]  13 Pneumonia (Prevnar 13) All adults 65 years and older Dosed 1 year after Pneumovax 23 Pneumonia (Prevnar 20) One time alternative to the two dosing of 13 and 23 For all adults with initial dose of 23, 20 is recommended 1 year later For all adults with initial dose of 13, 23 is still recommended as second option 1 year later

## 2023-06-27 LAB — DHEA

## 2023-06-29 LAB — FSH/LH
FSH: 4.9 m[IU]/mL
LH: 22.8 m[IU]/mL

## 2023-06-29 LAB — TESTOSTERONE, FREE, TOTAL, SHBG: Sex Hormone Binding: 17.9 nmol/L — ABNORMAL LOW (ref 24.6–122.0)

## 2023-07-02 LAB — CMP14+EGFR
ALT: 18 IU/L (ref 0–32)
AST: 15 IU/L (ref 0–40)
Albumin: 4.6 g/dL (ref 4.0–5.0)
Alkaline Phosphatase: 85 IU/L (ref 44–121)
BUN/Creatinine Ratio: 16 (ref 9–23)
BUN: 12 mg/dL (ref 6–20)
Bilirubin Total: 0.9 mg/dL (ref 0.0–1.2)
CO2: 23 mmol/L (ref 20–29)
Calcium: 9.8 mg/dL (ref 8.7–10.2)
Chloride: 100 mmol/L (ref 96–106)
Creatinine, Ser: 0.77 mg/dL (ref 0.57–1.00)
Globulin, Total: 2.1 g/dL (ref 1.5–4.5)
Glucose: 93 mg/dL (ref 70–99)
Potassium: 4.8 mmol/L (ref 3.5–5.2)
Sodium: 137 mmol/L (ref 134–144)
Total Protein: 6.7 g/dL (ref 6.0–8.5)
eGFR: 108 mL/min/{1.73_m2} (ref >59)

## 2023-07-02 LAB — LIPID PANEL
Chol/HDL Ratio: 2.8 ratio (ref 0.0–4.4)
Cholesterol, Total: 179 mg/dL (ref 100–199)
HDL: 64 mg/dL (ref >39)
LDL Chol Calc (NIH): 96 mg/dL (ref 0–99)
Triglycerides: 104 mg/dL (ref 0–149)
VLDL Cholesterol Cal: 19 mg/dL (ref 5–40)

## 2023-07-02 LAB — CBC WITH DIFFERENTIAL/PLATELET
Basophils Absolute: 0.1 10*3/uL (ref 0.0–0.2)
Basos: 1 %
EOS (ABSOLUTE): 0.1 10*3/uL (ref 0.0–0.4)
Eos: 1 %
Hematocrit: 44.8 % (ref 34.0–46.6)
Hemoglobin: 14.8 g/dL (ref 11.1–15.9)
Immature Grans (Abs): 0.1 10*3/uL (ref 0.0–0.1)
Immature Granulocytes: 1 %
Lymphocytes Absolute: 2.4 10*3/uL (ref 0.7–3.1)
Lymphs: 26 %
MCH: 28.5 pg (ref 26.6–33.0)
MCHC: 33 g/dL (ref 31.5–35.7)
MCV: 86 fL (ref 79–97)
Monocytes Absolute: 0.5 10*3/uL (ref 0.1–0.9)
Monocytes: 6 %
Neutrophils Absolute: 6.1 10*3/uL (ref 1.4–7.0)
Neutrophils: 65 %
Platelets: 290 10*3/uL (ref 150–450)
RBC: 5.19 x10E6/uL (ref 3.77–5.28)
RDW: 12 % (ref 11.7–15.4)
WBC: 9.2 10*3/uL (ref 3.4–10.8)

## 2023-07-02 LAB — LIPOPROTEIN A (LPA): Lipoprotein (a): 32.7 nmol/L (ref <75.0)

## 2023-07-02 LAB — 17-HYDROXYPROGESTERONE: 17-OH Progesterone LCMS: 106 ng/dL

## 2023-07-02 LAB — TESTOSTERONE, FREE, TOTAL, SHBG: Testosterone, Free: 4.1 pg/mL (ref 0.0–4.2)

## 2023-07-03 ENCOUNTER — Encounter: Payer: Self-pay | Admitting: Internal Medicine

## 2023-08-02 ENCOUNTER — Encounter: Payer: Self-pay | Admitting: Nurse Practitioner

## 2023-08-02 DIAGNOSIS — Z0000 Encounter for general adult medical examination without abnormal findings: Secondary | ICD-10-CM | POA: Insufficient documentation

## 2023-08-02 DIAGNOSIS — L988 Other specified disorders of the skin and subcutaneous tissue: Secondary | ICD-10-CM | POA: Insufficient documentation

## 2023-08-02 NOTE — Assessment & Plan Note (Signed)
History of PCOS, evidenced by irregular menstrual cycles and testosterone-dominant symptoms. She has stopped progesterone supplements recently to see how this impacts her symptoms. She has concerns about weight and previously diagnosed fatty liver despite improvement of her diet and exercise. Consider possible insulin resistance as contributing to her difficulty in managing her weight. Labs pending.  Plan: - Continue monitoring PCOS symptoms. - Consider resuming progesterone if menstrual irregularities increase or become problematic. - Discussed potential use of medications like Wegovy for weight management if insurance coverage available. - Monitor liver health and consider interventions for fatty liver. - Encourage continued physical activity and healthy eating to manage weight and insulin resistance.

## 2023-08-02 NOTE — Assessment & Plan Note (Signed)
CPE completed today.   Labs ordered. Will make changes as necessary based on results.  Review of HM activities and recommendations discussed and provided on AVS Anticipatory guidance, diet, and exercise recommendations provided.  Medications, allergies, and hx reviewed and updated as necessary.  Plan to f/u with CPE in 1 year or sooner for acute/chronic health needs as directed.   

## 2023-08-02 NOTE — Assessment & Plan Note (Signed)
Patient has history of moles and recent observation of small mole/polyp in ear canal. She has a dermatology appointment in September. At this time there are no alarm symptoms present that warrant an urgent need for evaluation.  Plan: - Advise having dermatologist examine ear canal during next visit in September.   - Continue monitoring skin for changes in existing moles or new ones.

## 2023-08-02 NOTE — Assessment & Plan Note (Signed)
Patient has been diagnosed with a form of dysautonomia similar to POTS, with symptoms exacerbated by dehydration and resolved significantly by increased salt intake. At this time there are no alarm symptoms present and she is doing well.  Plan: - Continue with increased salt intake and close monitoring.  - Encouraged adequate hydration, especially during physical activity or heat exposure.

## 2023-09-01 DIAGNOSIS — L814 Other melanin hyperpigmentation: Secondary | ICD-10-CM | POA: Diagnosis not present

## 2023-09-01 DIAGNOSIS — D225 Melanocytic nevi of trunk: Secondary | ICD-10-CM | POA: Diagnosis not present

## 2023-09-01 DIAGNOSIS — L728 Other follicular cysts of the skin and subcutaneous tissue: Secondary | ICD-10-CM | POA: Diagnosis not present

## 2023-12-16 DIAGNOSIS — H5213 Myopia, bilateral: Secondary | ICD-10-CM | POA: Diagnosis not present

## 2023-12-16 DIAGNOSIS — H52203 Unspecified astigmatism, bilateral: Secondary | ICD-10-CM | POA: Diagnosis not present

## 2024-01-15 NOTE — Progress Notes (Unsigned)
   Rubin Payor, PhD, LAT, ATC acting as a scribe for Clementeen Graham, MD.  Anne Cardenas is a 28 y.o. female who presents to [facility] today for shoulder pain x ***. She noticed the pain and mechanical symptoms after playing tennis. Pt locates pain to ***.  Radiates: Aggravates: Treatments tried:  Pertinent review of systems: ***  Relevant historical information: ***   Exam:  There were no vitals taken for this visit. General: Well Developed, well nourished, and in no acute distress.   MSK: ***    Lab and Radiology Results No results found for this or any previous visit (from the past 72 hours). No results found.     Assessment and Plan: 28 y.o. female with ***   PDMP not reviewed this encounter. No orders of the defined types were placed in this encounter.  No orders of the defined types were placed in this encounter.    Discussed warning signs or symptoms. Please see discharge instructions. Patient expresses understanding.   ***

## 2024-01-18 ENCOUNTER — Other Ambulatory Visit: Payer: Self-pay

## 2024-01-18 ENCOUNTER — Ambulatory Visit (INDEPENDENT_AMBULATORY_CARE_PROVIDER_SITE_OTHER): Payer: 59

## 2024-01-18 ENCOUNTER — Encounter: Payer: Self-pay | Admitting: Family Medicine

## 2024-01-18 ENCOUNTER — Ambulatory Visit (INDEPENDENT_AMBULATORY_CARE_PROVIDER_SITE_OTHER): Payer: 59 | Admitting: Family Medicine

## 2024-01-18 VITALS — BP 122/84 | HR 90 | Ht 64.5 in | Wt 218.0 lb

## 2024-01-18 DIAGNOSIS — G8929 Other chronic pain: Secondary | ICD-10-CM

## 2024-01-18 DIAGNOSIS — M25511 Pain in right shoulder: Secondary | ICD-10-CM

## 2024-01-18 NOTE — Progress Notes (Signed)
Right shoulder x-ray looks normal to radiology

## 2024-01-18 NOTE — Patient Instructions (Addendum)
Thank you for coming in today.   Please get an Xray today before you leave   I've referred you to Physical Therapy.  Let us know if you don't hear from them in one week.   Recheck in 8 weeks.    

## 2024-01-25 ENCOUNTER — Other Ambulatory Visit (HOSPITAL_BASED_OUTPATIENT_CLINIC_OR_DEPARTMENT_OTHER): Payer: Self-pay

## 2024-01-25 DIAGNOSIS — E282 Polycystic ovarian syndrome: Secondary | ICD-10-CM | POA: Diagnosis not present

## 2024-01-25 DIAGNOSIS — Z6836 Body mass index (BMI) 36.0-36.9, adult: Secondary | ICD-10-CM | POA: Diagnosis not present

## 2024-01-25 DIAGNOSIS — Z01419 Encounter for gynecological examination (general) (routine) without abnormal findings: Secondary | ICD-10-CM | POA: Diagnosis not present

## 2024-01-25 DIAGNOSIS — Z124 Encounter for screening for malignant neoplasm of cervix: Secondary | ICD-10-CM | POA: Diagnosis not present

## 2024-01-25 MED ORDER — SPIRONOLACTONE 100 MG PO TABS
100.0000 mg | ORAL_TABLET | Freq: Every day | ORAL | 3 refills | Status: AC
  Filled 2024-01-25 – 2024-03-08 (×2): qty 90, 90d supply, fill #0
  Filled 2024-06-16: qty 90, 90d supply, fill #1
  Filled 2024-09-19: qty 90, 90d supply, fill #2
  Filled 2024-12-15: qty 90, 90d supply, fill #3

## 2024-01-28 ENCOUNTER — Encounter: Payer: Self-pay | Admitting: Physical Therapy

## 2024-01-28 ENCOUNTER — Ambulatory Visit: Payer: 59 | Admitting: Physical Therapy

## 2024-01-28 DIAGNOSIS — M6281 Muscle weakness (generalized): Secondary | ICD-10-CM | POA: Diagnosis not present

## 2024-01-28 DIAGNOSIS — M25511 Pain in right shoulder: Secondary | ICD-10-CM

## 2024-01-28 NOTE — Therapy (Signed)
OUTPATIENT PHYSICAL THERAPY SHOULDER EVALUATION   Patient Name: Anne Cardenas MRN: [id number] DOB:[birth year], 28 y.o., female Today's Date: 01/28/2024  END OF SESSION:  PT End of Session - 01/28/24 0850     Visit Number 1    Number of Visits 4    Date for PT Re-Evaluation 03/24/24    PT Start Time 0805    PT Stop Time 0843    PT Time Calculation (min) 38 min    Activity Tolerance Patient tolerated treatment well             Past Medical History:  Diagnosis Date   Bowel habit changes 05/09/2022   Hypotension    Myopia    PCOS (polycystic ovarian syndrome)    Right lower quadrant abdominal pain 05/09/2022   Tachycardia    Tachycardia 05/09/2022   History reviewed. No pertinent surgical history. Patient Active Problem List   Diagnosis Date Noted   Annual physical exam 08/02/2023   Skin macule 08/02/2023   Lymphocytosis 05/09/2022   Dysautonomia (HCC) 05/09/2022   Hidradenitis axillaris 03/28/2021   Keratoconjunctivitis sicca of both eyes not specified as Sjogren's 12/27/2019   Vitreous floater, bilateral 12/27/2019   PCOS (polycystic ovarian syndrome) 10/02/2017   Abnormal weight gain 09/25/2017   Hirsutism 09/25/2017   Myopia of both eyes with astigmatism 09/04/2017   Acne vulgaris 02/16/2016   Excessive sweating 02/16/2016    PCP: Tollie Eth, NP   REFERRING PROVIDER: Rodolph Bong, MD   REFERRING DIAG: [phone] (ICD-10-CM) - Chronic right shoulder pain  THERAPY DIAG:  Acute pain of right shoulder  Muscle weakness (generalized)  Rationale for Evaluation and Treatment: Rehabilitation  ONSET DATE: 11/23/23  SUBJECTIVE:                                                                                                                                                                                      SUBJECTIVE STATEMENT:  R shoulder pain ongoing since around Thanksgiving. She noticed the pain and mechanical symptoms after playing tennis. She  is used to playing tennis but has had some time off for a while before this time playing.   Hand dominance: Right  PERTINENT HISTORY:none in regards to shoulder   PAIN:  NPRS scale: 4/10 with activity, none at rest Pain location:Rt lateral/superior shoulder Pain description: intermittent, popping pain Aggravating factors: tennis particularly overhead serve, lat pulldowns and other gym lifitng Relieving factors: rest,   PRECAUTIONS: None  RED FLAGS: None   WEIGHT BEARING RESTRICTIONS: No  FALLS:  Has patient fallen in last 6 months? No   OCCUPATION:She works as a Teacher, early years/pre at [employer].   PLOF:  Independent  PATIENT GOALS:return to tennis, return to normal gym activity without the pain.   NEXT MD VISIT:   OBJECTIVE:  Note: Objective measures were completed at Evaluation unless otherwise noted.  DIAGNOSTIC FINDINGS:  XR IMPRESSION: Negative.  Diagnostic Limited MSK Ultrasound of: Right shoulder Biceps tendon is intact and normal-appearing Subscapularis tendon is intact and normal-appearing. Supraspinatus tendon is intact with mild subacromial bursitis present. Infraspinatus tendon is intact and normal-appearing. AC joint intact and normal-appearing Impression: Subacromial bursitis  PATIENT SURVEYS:  FOTO 75% functional, goal is 82%  COGNITION: Overall cognitive status: Within functional limits for tasks assessed     SENSATION: WFL   UPPER EXTREMITY ROM: WNL all planes bilat    UPPER EXTREMITY MMT:  MMT Right eval   Shoulder flexion 5   Shoulder extension 4   Shoulder abduction 5   Shoulder adduction    Shoulder internal rotation 5   Shoulder external rotation 4   Middle trapezius 3   Lower trapezius 3   Elbow flexion    Elbow extension    Wrist flexion    Wrist extension    Wrist ulnar deviation    Wrist radial deviation    Wrist pronation    Wrist supination    Grip strength (lbs)    (Blank rows = not tested)  SHOULDER  SPECIAL TESTS: Impingement tests: Hawkins/Kennedy impingement test: positive  and Painful arc test: positive  JOINT MOBILITY TESTING:  WNL                                                                                                              TODAY'S TREATMENT:  Eval HEP creation and review with demonstration and trial set preformed, see below for details  Iontophoresis 4-6 hour wear home patch #1 with 1.0CC dexamethasone and saline to Rt shoulder. Verbal instructions and education provided.     PATIENT EDUCATION: Education details: HEP, PT plan of care Person educated: Patient Education method: Explanation, Demonstration, Verbal cues, and Handouts Education comprehension: verbalized understanding and needs further education   HOME EXERCISE PROGRAM: Access Code: [email or web address] URL: [email or web address] Date: 01/28/2024 Prepared by: Ivery Quale  Exercises - Standing Shoulder Posterior Capsule Stretch  - 2 x daily - 6 x weekly - 1 sets - 5 reps - 10 sec hold - Doorway Pec Stretch at 90 Degrees Abduction  - 2 x daily - 6 x weekly - 1 sets - 10 reps - 10 sec hold - Shoulder External Rotation with Anchored Resistance  - 2 x daily - 6 x weekly - 3 sets - 10-15 reps - Prone Single Arm Shoulder Y  - 2 x daily - 6 x weekly - 2 sets - 10-15 reps - Prone Single Arm Shoulder Horizontal Abduction with Scapular Retraction and Palm Down (Mirrored)  - 2 x daily - 6 x weekly - 2 sets - 10-15 reps - Standing Shoulder Horizontal Abduction with Resistance  - 2 x daily - 6 x weekly - 1 sets - 10-15 reps  ASSESSMENT:  CLINICAL IMPRESSION: Patient referred  to PT for Rt shoulder pain with signs and symptoms consitent with impingement and bursitis. She has RTC weakness and scapular weakness noted. Patient will benefit from skilled PT to address below impairments, limitations and improve overall function.  OBJECTIVE IMPAIRMENTS: decreased activity tolerance, decreased strength, impaired UE  use, ,and pain.  ACTIVITY LIMITATIONS: reaching, lifting, gym activity, tennis PERSONAL FACTORS: none  REHAB POTENTIAL: Very Good  CLINICAL DECISION MAKING: Stable/uncomplicated  EVALUATION COMPLEXITY: Low    GOALS: Short term PT Goals Target date: 02/25/2024   Pt will be I and compliant with HEP. Baseline:  Goal status: New Pt will decrease pain by 25% overall Baseline: Goal status: New  Long term PT goals Target date:03/24/2024  Pt will improve  Rt shoulder strength to 5/5 MMT to improve functional strength Baseline: Goal status: New Pt will improve FOTO to at least 82% functional to show improved function Baseline: Goal status: New Pt will reduce pain to overall less than 3/10 with usual activity, reaching overhead, gym activity, and tennis Baseline: Goal status: New  PLAN: PT FREQUENCY: 1-2 times per week   PT DURATION: 12 weeks  PLANNED INTERVENTIONS (unless contraindicated): Canalith repositioning, cryotherapy, Electrical stimulation, Iontophoresis with 4 mg/ml dexamethasome, Moist heat, traction, Ultrasound, gait training, Therapeutic exercise, balance training, neuromuscular re-education, patient/family education,  manual techniques, passive ROM, dry needling, taping, vasopnuematic device, vestibular, spinal manipulations, joint manipulations 97110-Therapeutic exercises, 97530- Therapeutic activity, [id number]- Neuromuscular re-education, 97535- Self Care, [id number]- Manual therapy, and 97033- Ionotophoresis 4mg /ml Dexamethasone  PLAN FOR NEXT SESSION: review HEP, how was ionto patch, consider DN. Needs scapular strength, work towards tennis    April Manson, PT,DPT 01/28/2024, 8:51 AM

## 2024-02-22 ENCOUNTER — Ambulatory Visit: Payer: 59 | Admitting: Physical Therapy

## 2024-02-22 ENCOUNTER — Encounter: Payer: Self-pay | Admitting: Physical Therapy

## 2024-02-22 DIAGNOSIS — M6281 Muscle weakness (generalized): Secondary | ICD-10-CM

## 2024-02-22 DIAGNOSIS — M25511 Pain in right shoulder: Secondary | ICD-10-CM | POA: Diagnosis not present

## 2024-02-22 NOTE — Therapy (Addendum)
 OUTPATIENT PHYSICAL THERAPY TREATMENT/discharge addendum PHYSICAL THERAPY DISCHARGE SUMMARY  Visits from Start of Care:   Current functional level related to goals / functional outcomes: See below   Remaining deficits: See below   Education / Equipment: HEP  Plan:  Patient goals were some met. Patient is being discharged due to not returning since last visit >30 days  Jamee Mazzoni, PT, DPT 04/11/24 3:16 PM     Patient Name: BURNETTA KOHLS MRN: [id number] DOB:1996-04-18, 28 y.o., female Today's Date: 02/22/2024  END OF SESSION:  PT End of Session - 02/22/24 0853     Visit Number 2    Number of Visits 4    Date for PT Re-Evaluation 03/24/24    PT Start Time 0805    PT Stop Time 0844    PT Time Calculation (min) 39 min    Activity Tolerance Patient tolerated treatment well             Past Medical History:  Diagnosis Date   Bowel habit changes 05/09/2022   Hypotension    Myopia    PCOS (polycystic ovarian syndrome)    Right lower quadrant abdominal pain 05/09/2022   Tachycardia    Tachycardia 05/09/2022   History reviewed. No pertinent surgical history. Patient Active Problem List   Diagnosis Date Noted   Annual physical exam 08/02/2023   Skin macule 08/02/2023   Lymphocytosis 05/09/2022   Dysautonomia (HCC) 05/09/2022   Hidradenitis axillaris 03/28/2021   Keratoconjunctivitis sicca of both eyes not specified as Sjogren's 12/27/2019   Vitreous floater, bilateral 12/27/2019   PCOS (polycystic ovarian syndrome) 10/02/2017   Abnormal weight gain 09/25/2017   Hirsutism 09/25/2017   Myopia of both eyes with astigmatism 09/04/2017   Acne vulgaris 02/16/2016   Excessive sweating 02/16/2016    PCP: Annella Kief, NP   REFERRING PROVIDER: Syliva Even, MD   REFERRING DIAG: [phone] (ICD-10-CM) - Chronic right shoulder pain  THERAPY DIAG:  Acute pain of right shoulder  Muscle weakness (generalized)  Rationale for Evaluation and Treatment:  Rehabilitation  ONSET DATE: 11/23/23  SUBJECTIVE:                                                                                                                                                                                      SUBJECTIVE STATEMENT: She feels Right shoulder is getting better overall. She has been doing the exercises. She plans to try independent program for now  Hand dominance: Right  PERTINENT HISTORY:none in regards to shoulder   PAIN:  NPRS scale: 1/10 Pain location:Rt lateral/superior shoulder Pain description: intermittent, popping pain Aggravating factors: tennis particularly overhead serve, lat pulldowns and other  gym lifitng Relieving factors: rest,   PRECAUTIONS: None  RED FLAGS: None   WEIGHT BEARING RESTRICTIONS: No  FALLS:  Has patient fallen in last 6 months? No   OCCUPATION:She works as a Teacher, early years/pre at [employer].   PLOF: Independent  PATIENT GOALS:return to tennis, return to normal gym activity without the pain.   NEXT MD VISIT:   OBJECTIVE:  Note: Objective measures were completed at Evaluation unless otherwise noted.  DIAGNOSTIC FINDINGS:  XR IMPRESSION: Negative.  Diagnostic Limited MSK Ultrasound of: Right shoulder Biceps tendon is intact and normal-appearing Subscapularis tendon is intact and normal-appearing. Supraspinatus tendon is intact with mild subacromial bursitis present. Infraspinatus tendon is intact and normal-appearing. AC joint intact and normal-appearing Impression: Subacromial bursitis  PATIENT SURVEYS:  FOTO 75% functional, goal is 82%  COGNITION: Overall cognitive status: Within functional limits for tasks assessed     SENSATION: WFL   UPPER EXTREMITY ROM: WNL all planes bilat    UPPER EXTREMITY MMT:  MMT Right eval Right 02/22/24  Shoulder flexion 5   Shoulder extension 4 4+  Shoulder abduction 5   Shoulder adduction    Shoulder internal rotation 5   Shoulder external  rotation 4 5  Middle trapezius 3 4+  Lower trapezius 3 4+  Elbow flexion    Elbow extension    Wrist flexion    Wrist extension    Wrist ulnar deviation    Wrist radial deviation    Wrist pronation    Wrist supination    Grip strength (lbs)    (Blank rows = not tested)  SHOULDER SPECIAL TESTS: Impingement tests: Hawkins/Kennedy impingement test: positive  and Painful arc test: positive  JOINT MOBILITY TESTING:  WNL                                                                                                              TODAY'S TREATMENT:  02/22/24 HEP progressions with review Bilat ER blue 2X15 Bilat horizontal abd blue 2X15 Standing shoulder extensions blue 2X15 Standing face pulls blue 2X10 Standing shoulder adduction 2X10 bilat Standing shoulder press 5# X10, then 10# X 10 bilat      PATIENT EDUCATION: Education details: HEP, PT plan of care Person educated: Patient Education method: Explanation, Demonstration, Verbal cues, and Handouts Education comprehension: verbalized understanding and needs further education   HOME EXERCISE PROGRAM: Access Code: [email or web address] URL: [email or web address] Date: 01/28/2024 Prepared by: Jamee Mazzoni  Exercises - Standing Shoulder Posterior Capsule Stretch  - 2 x daily - 6 x weekly - 1 sets - 5 reps - 10 sec hold - Doorway Pec Stretch at 90 Degrees Abduction  - 2 x daily - 6 x weekly - 1 sets - 10 reps - 10 sec hold - Shoulder External Rotation with Anchored Resistance  - 2 x daily - 6 x weekly - 3 sets - 10-15 reps - Prone Single Arm Shoulder Y  - 2 x daily - 6 x weekly - 2 sets - 10-15 reps - Prone Single Arm Shoulder Horizontal Abduction with  Scapular Retraction and Palm Down (Mirrored)  - 2 x daily - 6 x weekly - 2 sets - 10-15 reps - Standing Shoulder Horizontal Abduction with Resistance  - 2 x daily - 6 x weekly - 1 sets - 10-15 reps  ASSESSMENT:  CLINICAL IMPRESSION: She had much improved scapular and RTC  strength with updated measurements and overall has made good progress with PT. I did progress her HEP and she will try this out for now and follow up with PT if needed.  OBJECTIVE IMPAIRMENTS: decreased activity tolerance, decreased strength, impaired UE use, ,and pain.  ACTIVITY LIMITATIONS: reaching, lifting, gym activity, tennis PERSONAL FACTORS: none  REHAB POTENTIAL: Very Good  CLINICAL DECISION MAKING: Stable/uncomplicated  EVALUATION COMPLEXITY: Low    GOALS: Short term PT Goals Target date: 02/25/2024   Pt will be I and compliant with HEP. Baseline:  Goal status: New Pt will decrease pain by 25% overall Baseline: Goal status: New  Long term PT goals Target date:03/24/2024  Pt will improve  Rt shoulder strength to 5/5 MMT to improve functional strength Baseline: Goal status: New Pt will improve FOTO to at least 82% functional to show improved function Baseline: Goal status: New Pt will reduce pain to overall less than 3/10 with usual activity, reaching overhead, gym activity, and tennis Baseline: Goal status: New  PLAN: PT FREQUENCY: 1-2 times per week   PT DURATION: 12 weeks  PLANNED INTERVENTIONS (unless contraindicated): Canalith repositioning, cryotherapy, Electrical stimulation, Iontophoresis with 4 mg/ml dexamethasome, Moist heat, traction, Ultrasound, gait training, Therapeutic exercise, balance training, neuromuscular re-education, patient/family education,  manual techniques, passive ROM, dry needling, taping, vasopnuematic device, vestibular, spinal manipulations, joint manipulations 97110-Therapeutic exercises, 97530- Therapeutic activity, 97112- Neuromuscular re-education, 97535- Self Care, 16109- Manual therapy, and 97033- Ionotophoresis 4mg /ml Dexamethasone  PLAN FOR NEXT SESSION: I did progress her HEP and she will try this out for now and follow up with PT if needed.    Mick Alamin, PT,DPT 02/22/2024, 8:55 AM

## 2024-03-08 ENCOUNTER — Other Ambulatory Visit (HOSPITAL_BASED_OUTPATIENT_CLINIC_OR_DEPARTMENT_OTHER): Payer: Self-pay

## 2024-03-14 ENCOUNTER — Ambulatory Visit: Payer: 59 | Admitting: Family Medicine

## 2024-06-29 ENCOUNTER — Ambulatory Visit: Payer: Commercial Managed Care - PPO | Admitting: Nurse Practitioner

## 2024-06-29 ENCOUNTER — Encounter: Payer: Self-pay | Admitting: Nurse Practitioner

## 2024-06-29 VITALS — BP 102/66 | HR 91 | Ht 64.0 in | Wt 191.2 lb

## 2024-06-29 DIAGNOSIS — G901 Familial dysautonomia [Riley-Day]: Secondary | ICD-10-CM | POA: Diagnosis not present

## 2024-06-29 DIAGNOSIS — E282 Polycystic ovarian syndrome: Secondary | ICD-10-CM

## 2024-06-29 DIAGNOSIS — D72820 Lymphocytosis (symptomatic): Secondary | ICD-10-CM

## 2024-06-29 DIAGNOSIS — R635 Abnormal weight gain: Secondary | ICD-10-CM | POA: Diagnosis not present

## 2024-06-29 DIAGNOSIS — Z0000 Encounter for general adult medical examination without abnormal findings: Secondary | ICD-10-CM

## 2024-06-29 LAB — LIPID PANEL

## 2024-06-29 NOTE — Progress Notes (Signed)
 Catheline Doing, DNP, AGNP-c [facility] [address] [place], [place] [address] Main Office [phone]  BP 102/66   Pulse 91   Ht 5' 4 (1.626 m)   Wt 191 lb 3.2 oz (86.7 kg)   LMP 06/14/2024   SpO2 98%   BMI 32.82 kg/m    Subjective:    Patient ID: Anne Cardenas, female    DOB: 10-Jul-1996, 28 y.o.   MRN: [id number]  HPI: History of Present Illness Anne Cardenas is a 28 year old female here for Annual Exam.   She has been working hard to manage her PCOS symptoms with ongoing diet and exercise.  Her PCOS has led to weight gain and was previously diagnosed with fatty liver at the age of 47. She recalls gaining 30 pounds in a month despite being active and maintaining her diet. Her weight was 235 pounds at its peak but only was able to get the weight down to 215 over a 4-year period of very intense diet and exercise.   Her menstrual cycles have shown some changes, which she attributes to her weight loss. She notes that her hidradenitis suppurativa has not flared up since she received laser treatment years ago. She uses sparingly to prevent flare-ups.  She has been experiencing improvements in her mental clarity and energy levels since losing weight. Her bowel movements have normalized, which she attributes to reduced inflammation. She has been taking probiotics for years to manage gut health issues that began after extensive antibiotic use.  Pertinent items are noted in HPI.   Most Recent Depression Screen:     06/29/2024    8:18 AM 06/26/2023   10:26 AM 05/05/2022    8:14 AM  Depression screen PHQ 2/9  Decreased Interest 0 0 0  Down, Depressed, Hopeless 0 0 0  PHQ - 2 Score 0 0 0   Most Recent Anxiety Screen:      No data to display         Most Recent Fall Screen:    06/29/2024    8:17 AM 06/26/2023   10:26 AM 05/05/2022    8:13 AM 03/28/2021    3:18 PM  Fall Risk   Falls in the past year? 0 0 0 0  Number falls in past yr: 0 0 0 0  Injury with  Fall? 0 0 0 0  Risk for fall due to : No Fall Risks No Fall Risks No Fall Risks   Follow up Falls evaluation completed Falls evaluation completed Falls evaluation completed;Education provided       Data saved with a previous flowsheet row definition    Past medical history, surgical history, medications, allergies, family history and social history reviewed with patient today and changes made to appropriate areas of the chart.  Past Medical History:  Past Medical History:  Diagnosis Date   Bowel habit changes 05/09/2022   Hypotension    Myopia    PCOS (polycystic ovarian syndrome)    Right lower quadrant abdominal pain 05/09/2022   Tachycardia    Tachycardia 05/09/2022   Medications:  Current Outpatient Medications on File Prior to Visit  Medication Sig   aluminum chloride (DRYSOL) 20 % external solution Apply 1 application. topically daily as needed (excessive sweating).   bifidobacterium infantis (ALIGN) capsule Take 1 capsule by mouth daily.   ibuprofen (ADVIL) 200 MG tablet Take 400 mg by mouth every 6 (six) hours as needed for headache.   spironolactone  (ALDACTONE ) 100 MG tablet Take 1  tablet (100 mg total) by mouth daily.   No current facility-administered medications on file prior to visit.   Surgical History:  History reviewed. No pertinent surgical history. Allergies:  Allergies  Allergen Reactions   Ciprofloxacin Anxiety, Anaphylaxis and Other (See Comments)    Elevated heart rate and panicked feeling  Other Reaction(s): other   Family History:  Family History  Problem Relation Age of Onset   Hypertension Mother    Anxiety disorder Father    Hyperlipidemia Father    ADD / ADHD Sister    Depression Sister    Stroke Paternal Grandfather    Heart disease Paternal Grandfather    Hashimoto's thyroiditis Sister    Anxiety disorder Sister    ADD / ADHD Sister        Objective:    BP 102/66   Pulse 91   Ht 5' 4 (1.626 m)   Wt 191 lb 3.2 oz (86.7 kg)    LMP 06/14/2024   SpO2 98%   BMI 32.82 kg/m   Wt Readings from Last 3 Encounters:  06/29/24 191 lb 3.2 oz (86.7 kg)  01/18/24 218 lb (98.9 kg)  06/26/23 214 lb 12.8 oz (97.4 kg)    Physical Exam Vitals and nursing note reviewed.  Constitutional:      General: She is not in acute distress.    Appearance: Normal appearance.  HENT:     Head: Normocephalic and atraumatic.     Right Ear: Hearing, tympanic membrane, ear canal and external ear normal.     Left Ear: Hearing, tympanic membrane, ear canal and external ear normal.     Nose: Nose normal.     Right Sinus: No maxillary sinus tenderness or frontal sinus tenderness.     Left Sinus: No maxillary sinus tenderness or frontal sinus tenderness.     Mouth/Throat:     Lips: Pink.     Mouth: Mucous membranes are moist.     Pharynx: Oropharynx is clear.  Eyes:     General: Lids are normal. Vision grossly intact.     Extraocular Movements: Extraocular movements intact.     Conjunctiva/sclera: Conjunctivae normal.     Pupils: Pupils are equal, round, and reactive to light.     Funduscopic exam:    Right eye: Red reflex present.        Left eye: Red reflex present.    Visual Fields: Right eye visual fields normal and left eye visual fields normal.  Neck:     Thyroid : No thyromegaly.     Vascular: No carotid bruit.  Cardiovascular:     Rate and Rhythm: Normal rate and regular rhythm.     Chest Wall: PMI is not displaced.     Pulses: Normal pulses.          Dorsalis pedis pulses are 2+ on the right side and 2+ on the left side.       Posterior tibial pulses are 2+ on the right side and 2+ on the left side.     Heart sounds: Normal heart sounds. No murmur heard. Pulmonary:     Effort: Pulmonary effort is normal. No respiratory distress.     Breath sounds: Normal breath sounds.  Abdominal:     General: Abdomen is flat. Bowel sounds are normal. There is no distension.     Palpations: Abdomen is soft. There is no hepatomegaly,  splenomegaly or mass.     Tenderness: There is no abdominal tenderness. There is no right CVA tenderness,  left CVA tenderness, guarding or rebound.  Musculoskeletal:        General: Normal range of motion.     Cervical back: Full passive range of motion without pain, normal range of motion and neck supple. No tenderness.     Right lower leg: No edema.     Left lower leg: No edema.  Feet:     Left foot:     Toenail Condition: Left toenails are normal.  Lymphadenopathy:     Cervical: No cervical adenopathy.     Upper Body:     Right upper body: No supraclavicular adenopathy.     Left upper body: No supraclavicular adenopathy.  Skin:    General: Skin is warm and dry.     Capillary Refill: Capillary refill takes less than 2 seconds.     Nails: There is no clubbing.  Neurological:     General: No focal deficit present.     Mental Status: She is alert and oriented to person, place, and time.     GCS: GCS eye subscore is 4. GCS verbal subscore is 5. GCS motor subscore is 6.     Sensory: Sensation is intact. No sensory deficit.     Motor: Motor function is intact. No weakness.     Coordination: Coordination is intact. Coordination normal.     Gait: Gait is intact. Gait normal.     Deep Tendon Reflexes: Reflexes are normal and symmetric.  Psychiatric:        Attention and Perception: Attention normal.        Mood and Affect: Mood normal.        Speech: Speech normal.        Behavior: Behavior normal. Behavior is cooperative.        Thought Content: Thought content normal.        Cognition and Memory: Cognition and memory normal.        Judgment: Judgment normal.     Results for orders placed or performed in visit on 06/29/24  CBC with Differential/Platelet   Collection Time: 06/29/24  9:27 AM  Result Value Ref Range   WBC 8.5 3.4 - 10.8 x10E3/uL   RBC 4.88 3.77 - 5.28 x10E6/uL   Hemoglobin 14.2 11.1 - 15.9 g/dL   Hematocrit 56.6 65.9 - 46.6 %   MCV 89 79 - 97 fL   MCH 29.1 26.6  - 33.0 pg   MCHC 32.8 31.5 - 35.7 g/dL   RDW 87.2 88.2 - 84.5 %   Platelets 313 150 - 450 x10E3/uL   Neutrophils 68 Not Estab. %   Lymphs 24 Not Estab. %   Monocytes 6 Not Estab. %   Eos 1 Not Estab. %   Basos 0 Not Estab. %   Neutrophils Absolute 5.8 1.4 - 7.0 x10E3/uL   Lymphocytes Absolute 2.0 0.7 - 3.1 x10E3/uL   Monocytes Absolute 0.5 0.1 - 0.9 x10E3/uL   EOS (ABSOLUTE) 0.1 0.0 - 0.4 x10E3/uL   Basophils Absolute 0.0 0.0 - 0.2 x10E3/uL   Immature Granulocytes 1 Not Estab. %   Immature Grans (Abs) 0.1 0.0 - 0.1 x10E3/uL  Comprehensive metabolic panel with GFR   Collection Time: 06/29/24  9:27 AM  Result Value Ref Range   Glucose 85 70 - 99 mg/dL   BUN 9 6 - 20 mg/dL   Creatinine, Ser 9.16 0.57 - 1.00 mg/dL   eGFR 98 >40 fO/fpw/8.26   BUN/Creatinine Ratio 11 9 - 23   Sodium 140 134 - 144  mmol/L   Potassium 4.7 3.5 - 5.2 mmol/L   Chloride 105 96 - 106 mmol/L   CO2 20 20 - 29 mmol/L   Calcium 9.7 8.7 - 10.2 mg/dL   Total Protein 6.2 6.0 - 8.5 g/dL   Albumin 4.6 4.0 - 5.0 g/dL   Globulin, Total 1.6 1.5 - 4.5 g/dL   Bilirubin Total 0.6 0.0 - 1.2 mg/dL   Alkaline Phosphatase 68 44 - 121 IU/L   AST 11 0 - 40 IU/L   ALT 9 0 - 32 IU/L  Lipid panel   Collection Time: 06/29/24  9:27 AM  Result Value Ref Range   Cholesterol, Total 144 100 - 199 mg/dL   Triglycerides 66 0 - 149 mg/dL   HDL 56 >60 mg/dL   VLDL Cholesterol Cal 13 5 - 40 mg/dL   LDL Chol Calc (NIH) 75 0 - 99 mg/dL   Chol/HDL Ratio 2.6 0.0 - 4.4 ratio  Hemoglobin A1c   Collection Time: 06/29/24  9:27 AM  Result Value Ref Range   Hgb A1c MFr Bld 4.9 4.8 - 5.6 %   Est. average glucose Bld gHb Est-mCnc 94 mg/dL       Assessment & Plan:   Problem List Items Addressed This Visit     PCOS (polycystic ovarian syndrome)   Relevant Orders   CBC with Differential/Platelet (Completed)   Comprehensive metabolic panel with GFR (Completed)   Lipid panel (Completed)   Hemoglobin A1c (Completed)   Dysautonomia (HCC)    Relevant Orders   CBC with Differential/Platelet (Completed)   Comprehensive metabolic panel with GFR (Completed)   Lipid panel (Completed)   Hemoglobin A1c (Completed)   Annual physical exam - Primary   Lymphocytosis   Relevant Orders   CBC with Differential/Platelet (Completed)   Comprehensive metabolic panel with GFR (Completed)   Lipid panel (Completed)   Hemoglobin A1c (Completed)   Abnormal weight gain   Relevant Orders   CBC with Differential/Platelet (Completed)   Comprehensive metabolic panel with GFR (Completed)   Lipid panel (Completed)   Hemoglobin A1c (Completed)    Polycystic Ovary Syndrome (PCOS) PCOS with symptoms of insulin resistance, weight gain, and irregular periods. Self-administration of Ozempic has significantly improved symptoms, including reduced inflammation, decreased appetite, weight loss, and menstrual cycle changes. She is considering legal and sustainable options for obtaining the medication long-term. Discussion included the benefits of GLP-1 medications for PCOS and the possibility of tapering to maintain weight loss and regular cycles. Concerns about cost and insurance coverage of GLP-1 medications were expressed. - Discuss legal and sustainable options for obtaining GLP-1 medications like Ozempic or Wegovy. - Monitor insulin levels and A1c to assess metabolic status. - Encourage continuation of lifestyle modifications such as increased physical activity.  Fatty Liver Disease Fatty liver disease diagnosed via ultrasound at age 59. She is interested in potential new indications for GLP-1 medications for fatty liver disease. Discussion included the potential approval of GLP-1 medications for fatty liver disease and criteria for approval, such as ultrasound-confirmed fatty liver. - Monitor for updates on GLP-1 medication approval for fatty liver disease.  Hidradenitis Suppurativa Hidradenitis suppurativa managed with laser treatment and occasional  Drysol use. No recent flare-ups, but cautious about recurrence. - Continue management with Drysol as needed.  General Health Maintenance Engaged in health management through lifestyle modifications and medication. Aware of exercise benefits for cardiovascular, bone, and mental health, and the importance of regular health screenings and metabolic monitoring. - Encourage regular physical activity for cardiovascular, bone,  and mental health benefits. - Emphasize the importance of regular health screenings and metabolic monitoring.  Follow up plan: Return in about 1 year (around 06/29/2025) for CPE.  NEXT PREVENTATIVE PHYSICAL DUE IN 1 YEAR.  PATIENT COUNSELING PROVIDED FOR ALL ADULT PATIENTS: A well balanced diet low in saturated fats, cholesterol, and moderation in carbohydrates.  This can be as simple as monitoring portion sizes and cutting back on sugary beverages such as soda and juice to start with.    Daily water consumption of at least 64 ounces.  Physical activity at least 180 minutes per week.  If just starting out, start 10 minutes a day and work your way up.   This can be as simple as taking the stairs instead of the elevator and walking 2-3 laps around the office  purposefully every day.   STD protection, partner selection, and regular testing if high risk.  Limited consumption of alcoholic beverages if alcohol is consumed. For men, I recommend no more than 14 alcoholic beverages per week, spread out throughout the week (max 2 per day). Avoid binge drinking or consuming large quantities of alcohol in one setting.  Please let me know if you feel you may need help with reduction or quitting alcohol consumption.   Avoidance of nicotine, if used. Please let me know if you feel you may need help with reduction or quitting nicotine use.   Daily mental health attention. This can be in the form of 5 minute daily meditation, prayer, journaling, yoga, reflection, etc.  Purposeful  attention to your emotions and mental state can significantly improve your overall wellbeing  and  Health.  Please know that I am here to help you with all of your health care goals and am happy to work with you to find a solution that works best for you.  The greatest advice I have received with any changes in life are to take it one step at a time, that even means if all you can focus on is the next 60 seconds, then do that and celebrate your victories.  With any changes in life, you will have set backs, and that is OK. The important thing to remember is, if you have a set back, it is not a failure, it is an opportunity to try again! Screening Testing Mammogram Every 1 -2 years based on history and risk factors Starting at age 19 Pap Smear Ages 21-39 every 3 years Ages 73-65 every 5 years with HPV testing More frequent testing may be required based on results and history Colon Cancer Screening Every 1-10 years based on test performed, risk factors, and history Starting at age 26 Bone Density Screening Every 2-10 years based on history Starting at age 6 for women Recommendations for men differ based on medication usage, history, and risk factors AAA Screening One time ultrasound Men 26-34 years old who have every smoked Lung Cancer Screening Low Dose Lung CT every 12 months Age 49-80 years with a 30 pack-year smoking history who still smoke or who have quit within the last 15 years   Screening Labs Routine  Labs: Complete Blood Count (CBC), Complete Metabolic Panel (CMP), Cholesterol (Lipid Panel) Every 6-12 months based on history and medications May be recommended more frequently based on current conditions or previous results Hemoglobin A1c Lab Every 3-12 months based on history and previous results Starting at age 28 or earlier with diagnosis of diabetes, high cholesterol, BMI >26, and/or risk factors Frequent monitoring for patients with diabetes  to ensure blood sugar  control Thyroid  Panel (TSH) Every 6 months based on history, symptoms, and risk factors May be repeated more often if on medication HIV One time testing for all patients 43 and older May be repeated more frequently for patients with increased risk factors or exposure Hepatitis C One time testing for all patients 72 and older May be repeated more frequently for patients with increased risk factors or exposure Gonorrhea, Chlamydia Every 12 months for all sexually active persons 13-24 years Additional monitoring may be recommended for those who are considered high risk or who have symptoms Every 12 months for any woman on birth control, regardless of sexual activity PSA Men 46-40 years old with risk factors Additional screening may be recommended from age 66-69 based on risk factors, symptoms, and history  Vaccine Recommendations Tetanus Booster All adults every 10 years Flu Vaccine All patients 6 months and older every year COVID Vaccine All patients 12 years and older Initial dosing with booster May recommend additional booster based on age and health history HPV Vaccine 2 doses all patients age 52-26 Dosing may be considered for patients over 26 Shingles Vaccine (Shingrix) 2 doses all adults 55 years and older Pneumonia (Pneumovax 23) All adults 65 years and older May recommend earlier dosing based on health history One year apart from Prevnar 13 Pneumonia (Prevnar 66) All adults 65 years and older Dosed 1 year after Pneumovax 23 Pneumonia (Prevnar 20) One time alternative to the two dosing of 13 and 23 For all adults with initial dose of 23, 20 is recommended 1 year later For all adults with initial dose of 13, 23 is still recommended as second option 1 year later

## 2024-06-29 NOTE — Patient Instructions (Addendum)
 You look amazing!! I am SO PROUD OF YOU!!! Please keep up the great work!   I will let you know if I find out any information on the fatty liver treatment options.  If you need medication sent in by me, please just reach out and let me know.

## 2024-06-30 LAB — LIPID PANEL
Cholesterol, Total: 144 mg/dL (ref 100–199)
HDL: 56 mg/dL (ref >39)
LDL CALC COMMENT:: 2.6 ratio (ref 0.0–4.4)
LDL Chol Calc (NIH): 75 mg/dL (ref 0–99)
Triglycerides: 66 mg/dL (ref 0–149)
VLDL Cholesterol Cal: 13 mg/dL (ref 5–40)

## 2024-06-30 LAB — COMPREHENSIVE METABOLIC PANEL WITH GFR
ALT: 9 IU/L (ref 0–32)
AST: 11 IU/L (ref 0–40)
Albumin: 4.6 g/dL (ref 4.0–5.0)
Alkaline Phosphatase: 68 IU/L (ref 44–121)
BUN/Creatinine Ratio: 11 (ref 9–23)
BUN: 9 mg/dL (ref 6–20)
Bilirubin Total: 0.6 mg/dL (ref 0.0–1.2)
CO2: 20 mmol/L (ref 20–29)
Calcium: 9.7 mg/dL (ref 8.7–10.2)
Chloride: 105 mmol/L (ref 96–106)
Creatinine, Ser: 0.83 mg/dL (ref 0.57–1.00)
Globulin, Total: 1.6 g/dL (ref 1.5–4.5)
Glucose: 85 mg/dL (ref 70–99)
Potassium: 4.7 mmol/L (ref 3.5–5.2)
Sodium: 140 mmol/L (ref 134–144)
Total Protein: 6.2 g/dL (ref 6.0–8.5)
eGFR: 98 mL/min/{1.73_m2} (ref >59)

## 2024-06-30 LAB — CBC WITH DIFFERENTIAL/PLATELET
Basophils Absolute: 0 10*3/uL (ref 0.0–0.2)
Basos: 0 %
EOS (ABSOLUTE): 0.1 10*3/uL (ref 0.0–0.4)
Eos: 1 %
Hematocrit: 43.3 % (ref 34.0–46.6)
Hemoglobin: 14.2 g/dL (ref 11.1–15.9)
Immature Grans (Abs): 0.1 10*3/uL (ref 0.0–0.1)
Immature Granulocytes: 1 %
Lymphocytes Absolute: 2 10*3/uL (ref 0.7–3.1)
Lymphs: 24 %
MCH: 29.1 pg (ref 26.6–33.0)
MCHC: 32.8 g/dL (ref 31.5–35.7)
MCV: 89 fL (ref 79–97)
Monocytes Absolute: 0.5 10*3/uL (ref 0.1–0.9)
Monocytes: 6 %
Neutrophils Absolute: 5.8 10*3/uL (ref 1.4–7.0)
Neutrophils: 68 %
Platelets: 313 10*3/uL (ref 150–450)
RBC: 4.88 x10E6/uL (ref 3.77–5.28)
RDW: 12.7 % (ref 11.7–15.4)
WBC: 8.5 10*3/uL (ref 3.4–10.8)

## 2024-06-30 LAB — HEMOGLOBIN A1C
Est. average glucose Bld gHb Est-mCnc: 94 mg/dL
Hgb A1c MFr Bld: 4.9 % (ref 4.8–5.6)

## 2024-07-20 ENCOUNTER — Ambulatory Visit: Payer: Self-pay | Admitting: Nurse Practitioner

## 2024-09-07 DIAGNOSIS — L814 Other melanin hyperpigmentation: Secondary | ICD-10-CM | POA: Diagnosis not present

## 2024-09-07 DIAGNOSIS — L728 Other follicular cysts of the skin and subcutaneous tissue: Secondary | ICD-10-CM | POA: Diagnosis not present

## 2024-09-20 ENCOUNTER — Other Ambulatory Visit (HOSPITAL_COMMUNITY): Payer: Self-pay

## 2024-09-20 ENCOUNTER — Other Ambulatory Visit: Payer: Self-pay

## 2024-09-20 ENCOUNTER — Encounter: Payer: Self-pay | Admitting: Pharmacist

## 2024-10-25 ENCOUNTER — Other Ambulatory Visit (HOSPITAL_COMMUNITY): Payer: Self-pay

## 2024-10-25 MED ORDER — FLUZONE 0.5 ML IM SUSY
0.5000 mL | PREFILLED_SYRINGE | Freq: Once | INTRAMUSCULAR | 0 refills | Status: AC
  Filled 2024-10-25: qty 0.5, 1d supply, fill #0

## 2024-11-08 ENCOUNTER — Telehealth: Admitting: Family Medicine

## 2024-11-08 ENCOUNTER — Other Ambulatory Visit (HOSPITAL_COMMUNITY): Payer: Self-pay

## 2024-11-08 DIAGNOSIS — B9689 Other specified bacterial agents as the cause of diseases classified elsewhere: Secondary | ICD-10-CM

## 2024-11-08 DIAGNOSIS — J019 Acute sinusitis, unspecified: Secondary | ICD-10-CM

## 2024-11-08 MED ORDER — AMOXICILLIN-POT CLAVULANATE 875-125 MG PO TABS
1.0000 | ORAL_TABLET | Freq: Two times a day (BID) | ORAL | 0 refills | Status: AC
  Filled 2024-11-08: qty 14, 7d supply, fill #0

## 2024-11-08 MED ORDER — FLUTICASONE PROPIONATE 50 MCG/ACT NA SUSP
2.0000 | Freq: Every day | NASAL | 0 refills | Status: AC
  Filled 2024-11-08: qty 16, 30d supply, fill #0

## 2024-11-08 NOTE — Progress Notes (Signed)
 E-Visit for Sinus Problems  We are sorry that you are not feeling well.  Here is how we plan to help!  Based on what you have shared with me it looks like you have sinusitis.  Sinusitis is inflammation and infection in the sinus cavities of the head.  Based on your presentation I believe you most likely have Acute Bacterial Sinusitis.  This is an infection caused by bacteria and is treated with antibiotics. I have prescribed Augmentin 875mg /125mg  one tablet twice daily with food, for 7 days. and I have also prescribed Flonase Nasal Spray Use 2 sprays in each nostril daily for 10-14 days You may use an oral decongestant such as Mucinex D or if you have glaucoma or high blood pressure use plain Mucinex. Saline nasal spray help and can safely be used as often as needed for congestion.  If you develop worsening sinus pain, fever or notice severe headache and vision changes, or if symptoms are not better after completion of antibiotic, please schedule an appointment with a health care provider.    Sinus infections are not as easily transmitted as other respiratory infection, however we still recommend that you avoid close contact with loved ones, especially the very young and elderly.  Remember to wash your hands thoroughly throughout the day as this is the number one way to prevent the spread of infection!  Home Care: Only take medications as instructed by your medical team. Complete the entire course of an antibiotic. Do not take these medications with alcohol. A steam or ultrasonic humidifier can help congestion.  You can place a towel over your head and breathe in the steam from hot water coming from a faucet. Avoid close contacts especially the very young and the elderly. Cover your mouth when you cough or sneeze. Always remember to wash your hands.  Get Help Right Away If: You develop worsening fever or sinus pain. You develop a severe head ache or visual changes. Your symptoms persist after  you have completed your treatment plan.  Make sure you Understand these instructions. Will watch your condition. Will get help right away if you are not doing well or get worse.  Your e-visit answers were reviewed by a board certified advanced clinical practitioner to complete your personal care plan.  Depending on the condition, your plan could have included both over the counter or prescription medications.  If there is a problem please reply  once you have received a response from your provider.  Your safety is important to us .  If you have drug allergies check your prescription carefully.    You can use MyChart to ask questions about today's visit, request a non-urgent call back, or ask for a work or school excuse for 24 hours related to this e-Visit. If it has been greater than 24 hours you will need to follow up with your provider, or enter a new e-Visit to address those concerns.  You will get an e-mail in the next two days asking about your experience.  I hope that your e-visit has been valuable and will speed your recovery. Thank you for using e-visits.  I have spent 5 minutes in review of e-visit questionnaire, review and updating patient chart, medical decision making and response to patient.   Chiquita CHRISTELLA Barefoot, NP

## 2025-01-31 ENCOUNTER — Other Ambulatory Visit (HOSPITAL_COMMUNITY): Payer: Self-pay

## 2025-01-31 ENCOUNTER — Other Ambulatory Visit: Payer: Self-pay

## 2025-01-31 MED ORDER — SPIRONOLACTONE 50 MG PO TABS
50.0000 mg | ORAL_TABLET | Freq: Every day | ORAL | 3 refills | Status: AC
  Filled 2025-01-31: qty 90, 90d supply, fill #0

## 2025-07-12 ENCOUNTER — Encounter: Payer: Self-pay | Admitting: Nurse Practitioner
